# Patient Record
Sex: Female | Born: 1945 | Race: White | Hispanic: No | Marital: Married | Smoking: Never smoker
Health system: Southern US, Community
[De-identification: ages and names within clinical notes are randomized; demographics above are authoritative.]

---

## 1999-03-25 ENCOUNTER — Emergency Department (HOSPITAL_COMMUNITY): Admission: EM | Admit: 1999-03-25 | Discharge: 1999-03-25 | Payer: Self-pay | Admitting: Emergency Medicine

## 1999-03-25 ENCOUNTER — Encounter: Payer: Self-pay | Admitting: Emergency Medicine

## 1999-11-06 ENCOUNTER — Ambulatory Visit: Admission: RE | Admit: 1999-11-06 | Discharge: 1999-11-06 | Payer: Self-pay | Admitting: *Deleted

## 2000-06-04 ENCOUNTER — Ambulatory Visit: Admission: RE | Admit: 2000-06-04 | Discharge: 2000-06-04 | Payer: Self-pay | Admitting: *Deleted

## 2000-06-04 ENCOUNTER — Encounter: Payer: Self-pay | Admitting: *Deleted

## 2001-05-24 ENCOUNTER — Emergency Department (HOSPITAL_COMMUNITY): Admission: EM | Admit: 2001-05-24 | Discharge: 2001-05-25 | Payer: Self-pay | Admitting: Emergency Medicine

## 2001-05-24 ENCOUNTER — Encounter: Payer: Self-pay | Admitting: Emergency Medicine

## 2002-06-01 ENCOUNTER — Encounter: Payer: Self-pay | Admitting: Emergency Medicine

## 2002-06-01 ENCOUNTER — Emergency Department (HOSPITAL_COMMUNITY): Admission: EM | Admit: 2002-06-01 | Discharge: 2002-06-01 | Payer: Self-pay | Admitting: Emergency Medicine

## 2004-04-03 ENCOUNTER — Other Ambulatory Visit: Admission: RE | Admit: 2004-04-03 | Discharge: 2004-04-03 | Payer: Self-pay | Admitting: Obstetrics and Gynecology

## 2004-11-09 ENCOUNTER — Ambulatory Visit (HOSPITAL_COMMUNITY): Admission: RE | Admit: 2004-11-09 | Discharge: 2004-11-09 | Payer: Self-pay | Admitting: Obstetrics and Gynecology

## 2006-08-13 ENCOUNTER — Ambulatory Visit (HOSPITAL_COMMUNITY): Admission: RE | Admit: 2006-08-13 | Discharge: 2006-08-13 | Payer: Self-pay | Admitting: Obstetrics and Gynecology

## 2006-08-13 ENCOUNTER — Other Ambulatory Visit: Admission: RE | Admit: 2006-08-13 | Discharge: 2006-08-13 | Payer: Self-pay | Admitting: Obstetrics and Gynecology

## 2006-08-13 IMAGING — MG MM DIGITAL SCREENING BILAT
5 series · 5 of 5 positions shown · non-contrast
Comparison: Prior studies.

DG SCREEN MAMMOGRAM BILATERAL
Bilateral CC and MLO view(s) were taken.
Prior study comparison: [DATE], bilateral screening mammogram.

DIGITAL SCREENING MAMMOGRAM WITH CAD:

[R CC]
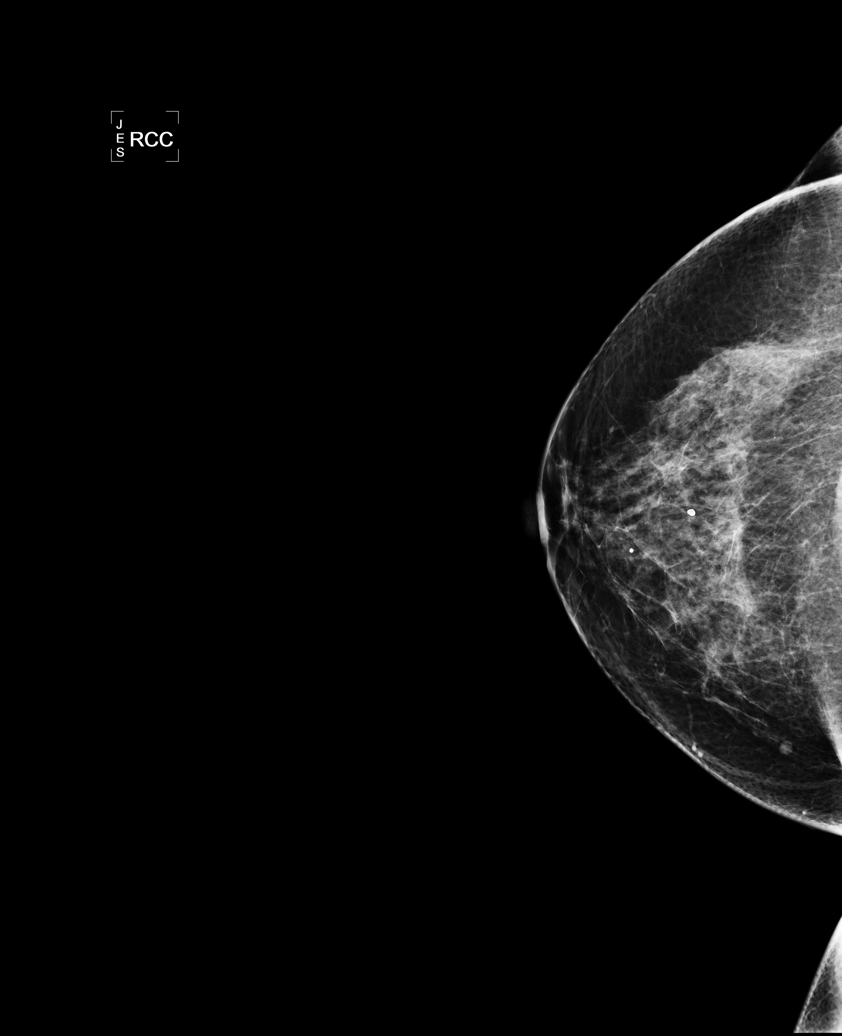

[R MLO]
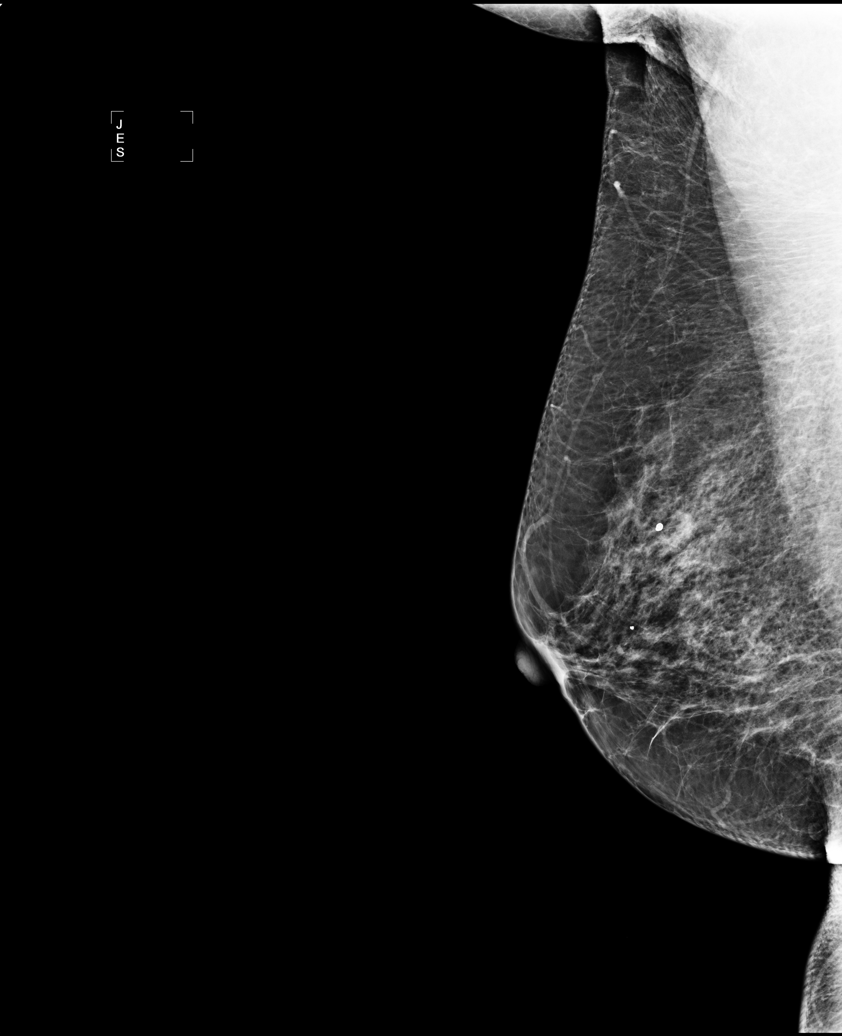

[L CC]
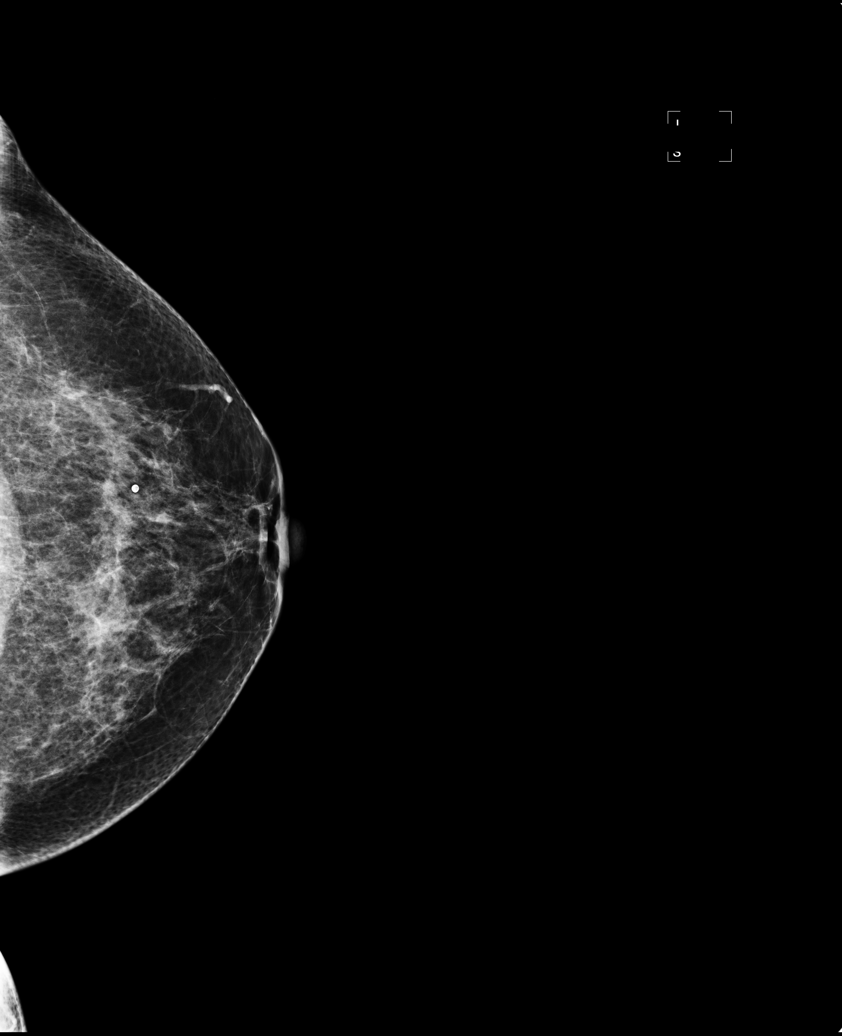

[L MLO (1 of 2)]
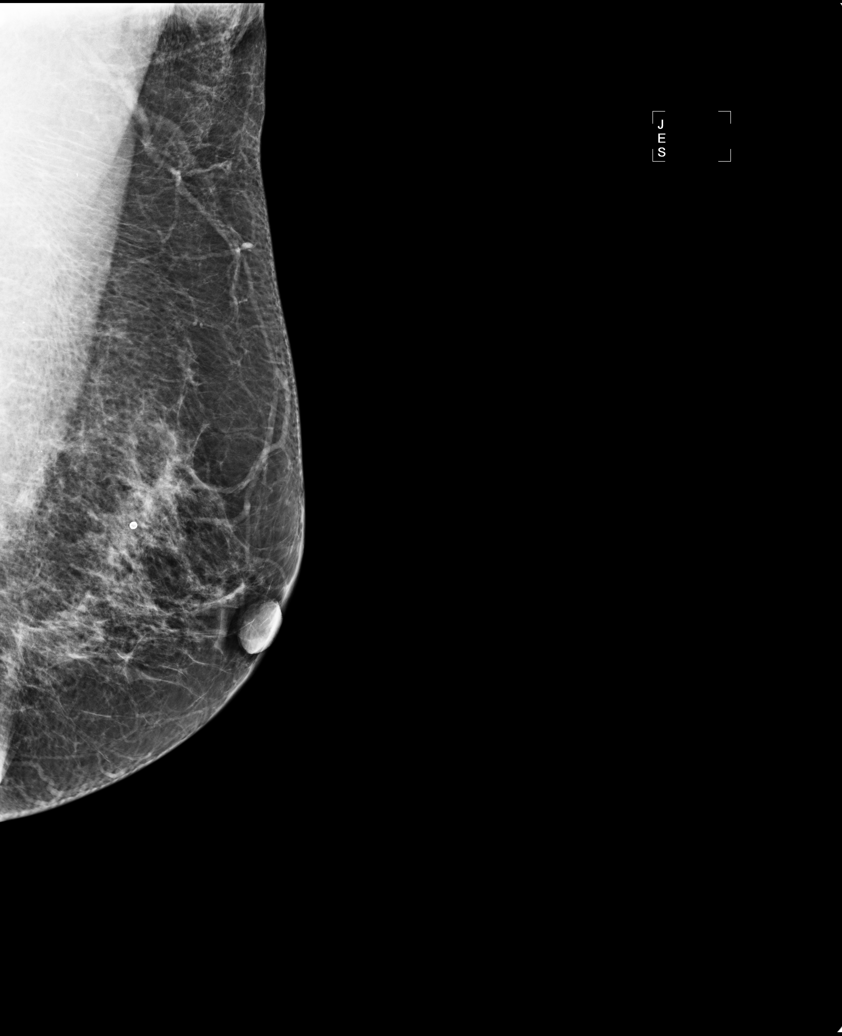

[L MLO (2 of 2)]
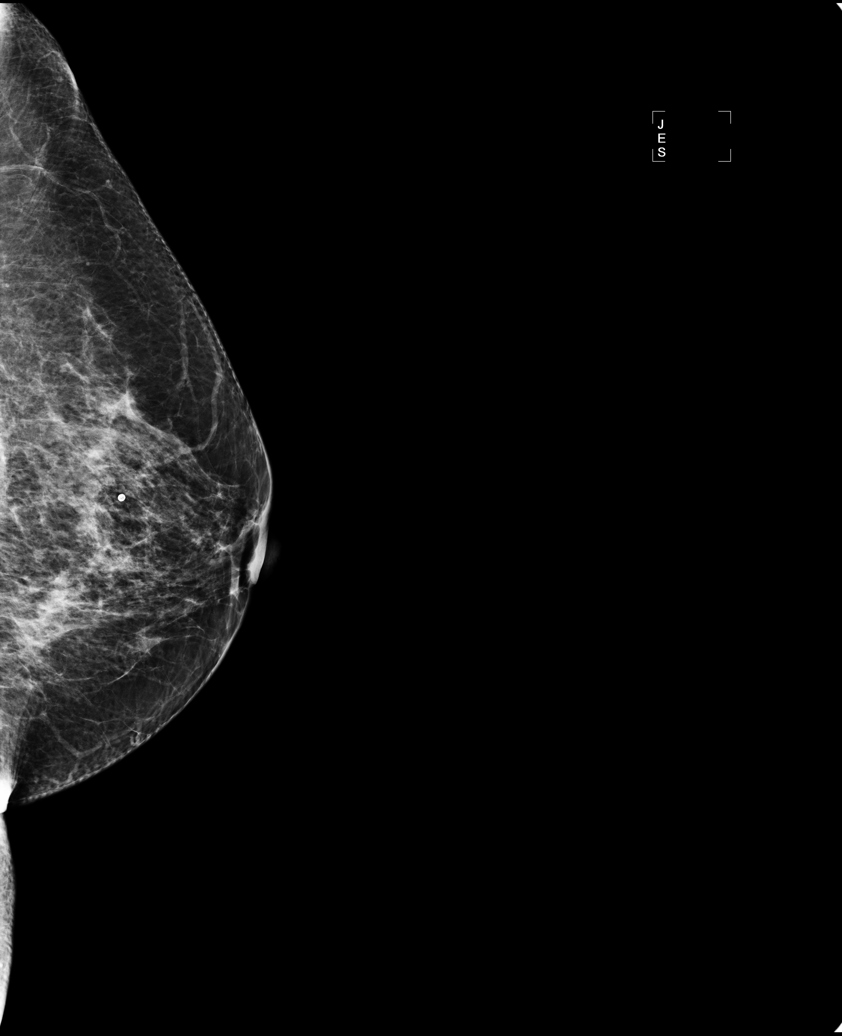

[5 of 5 positions shown; findings below may reference images not displayed]

There are scattered fibroglandular densities.  There is no dominant mass, architectural distortion 
or calcification to suggest malignancy.
IMPRESSION: No mammographic evidence of malignancy.  Suggest yearly screening mammography.

ASSESSMENT: Negative - BI-RADS 1

Screening mammogram in 1 year.
ANALYZED BY COMPUTER AIDED DETECTION. , THIS PROCEDURE WAS A DIGITAL MAMMOGRAM.

## 2006-09-09 ENCOUNTER — Encounter: Admission: RE | Admit: 2006-09-09 | Discharge: 2006-09-09 | Payer: Self-pay | Admitting: Obstetrics and Gynecology

## 2007-07-10 ENCOUNTER — Encounter: Payer: Self-pay | Admitting: Pulmonary Disease

## 2007-07-15 ENCOUNTER — Encounter: Payer: Self-pay | Admitting: Pulmonary Disease

## 2007-08-04 ENCOUNTER — Ambulatory Visit: Payer: Self-pay | Admitting: Pulmonary Disease

## 2007-08-04 DIAGNOSIS — K219 Gastro-esophageal reflux disease without esophagitis: Secondary | ICD-10-CM

## 2007-08-04 DIAGNOSIS — E785 Hyperlipidemia, unspecified: Secondary | ICD-10-CM

## 2007-08-04 DIAGNOSIS — R05 Cough: Secondary | ICD-10-CM

## 2007-08-11 ENCOUNTER — Other Ambulatory Visit: Admission: RE | Admit: 2007-08-11 | Discharge: 2007-08-11 | Payer: Self-pay | Admitting: Obstetrics and Gynecology

## 2008-06-22 ENCOUNTER — Other Ambulatory Visit: Admission: RE | Admit: 2008-06-22 | Discharge: 2008-06-22 | Payer: Self-pay | Admitting: Obstetrics and Gynecology

## 2008-06-22 ENCOUNTER — Encounter: Admission: RE | Admit: 2008-06-22 | Discharge: 2008-06-22 | Payer: Self-pay | Admitting: Obstetrics and Gynecology

## 2008-06-22 IMAGING — MG MM SCREEN MAMMOGRAM BILATERAL
5 series · 5 of 5 positions shown · non-contrast
Comparison: none

DG SCREEN MAMMOGRAM BILATERAL
Bilateral CC and MLO view(s) were taken.

DIGITAL SCREENING MAMMOGRAM WITH CAD:
There are scattered fibroglandular densities.  No masses or malignant type calcifications are 
identified.  Compared with prior studies.

[R CC (1 of 2)]
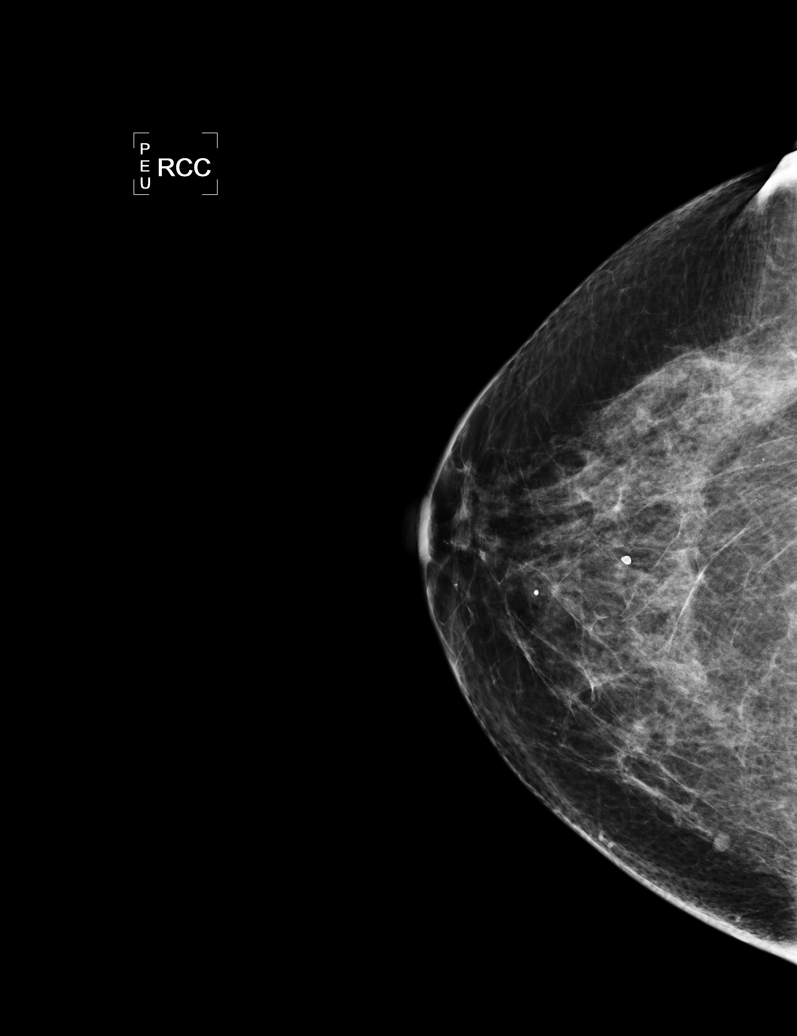

[L CC]
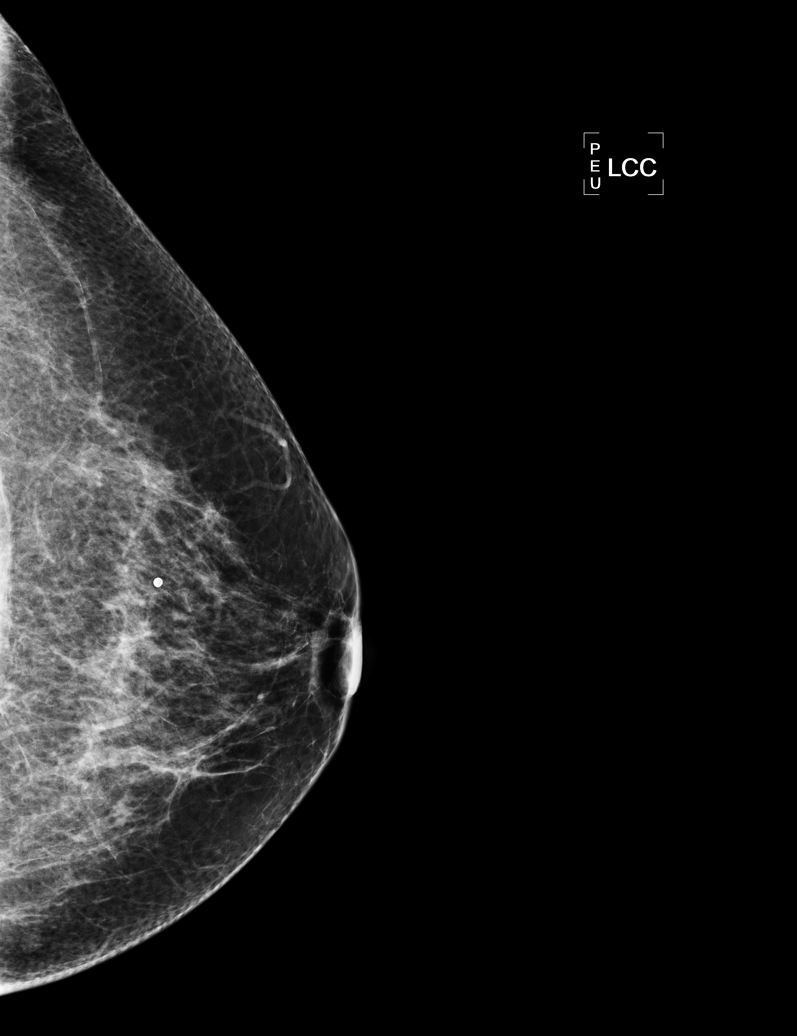

[L MLO]
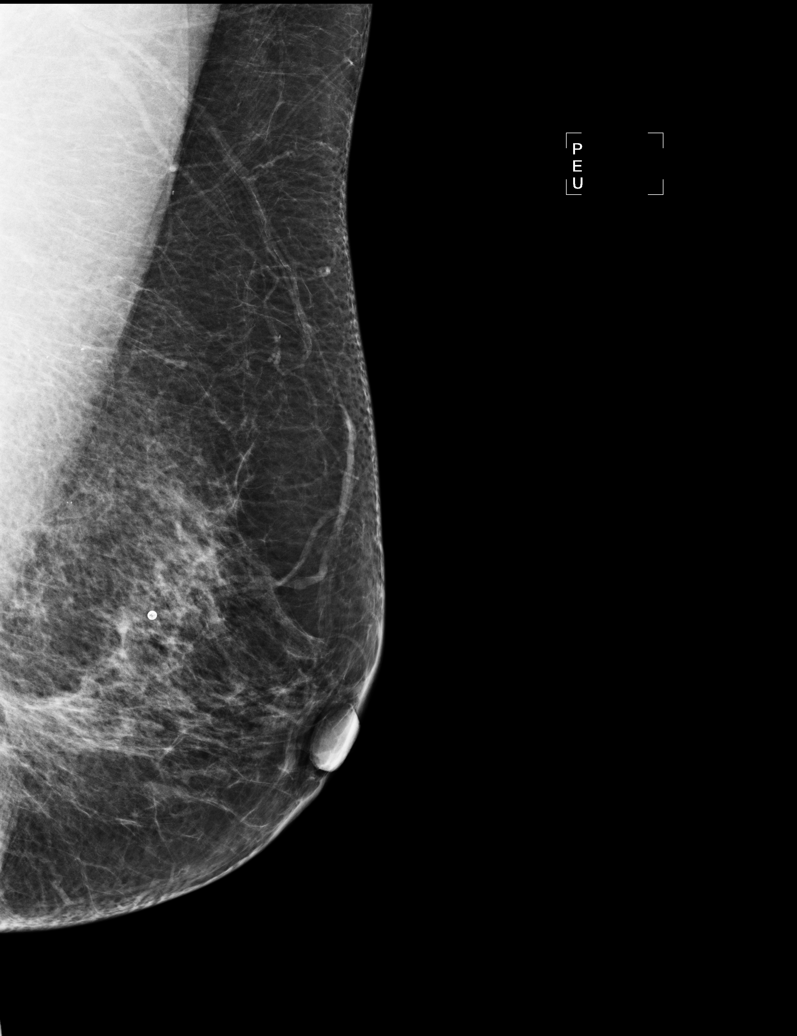

[R MLO]
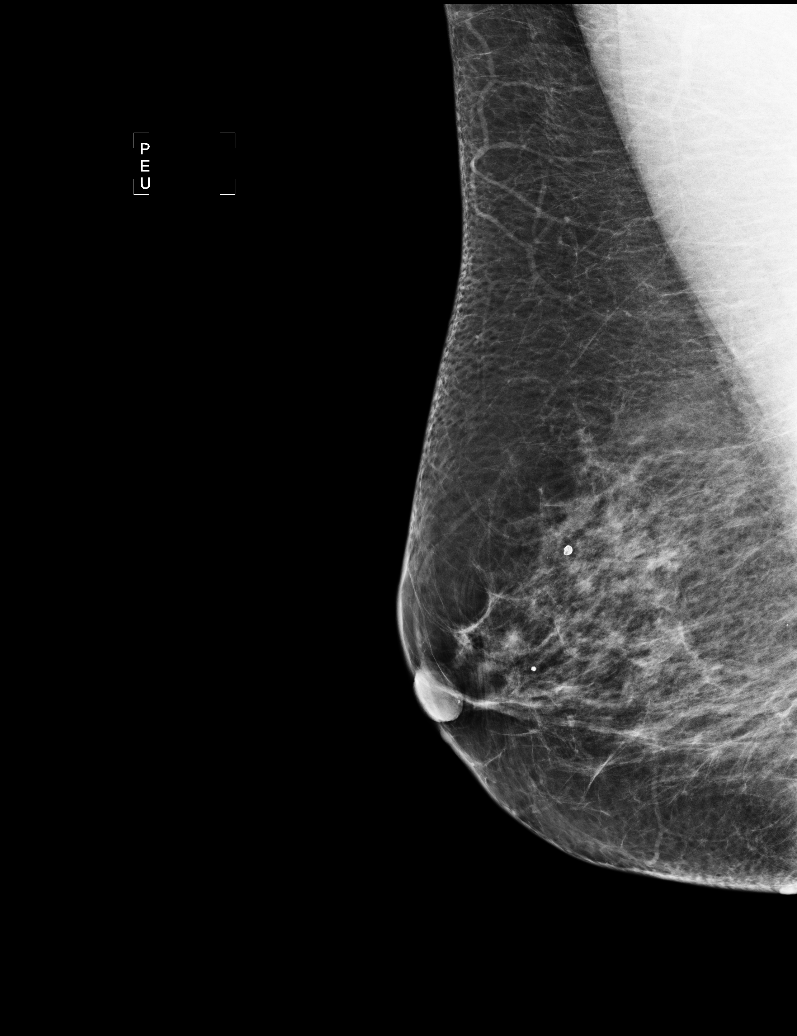

[R CC (2 of 2)]
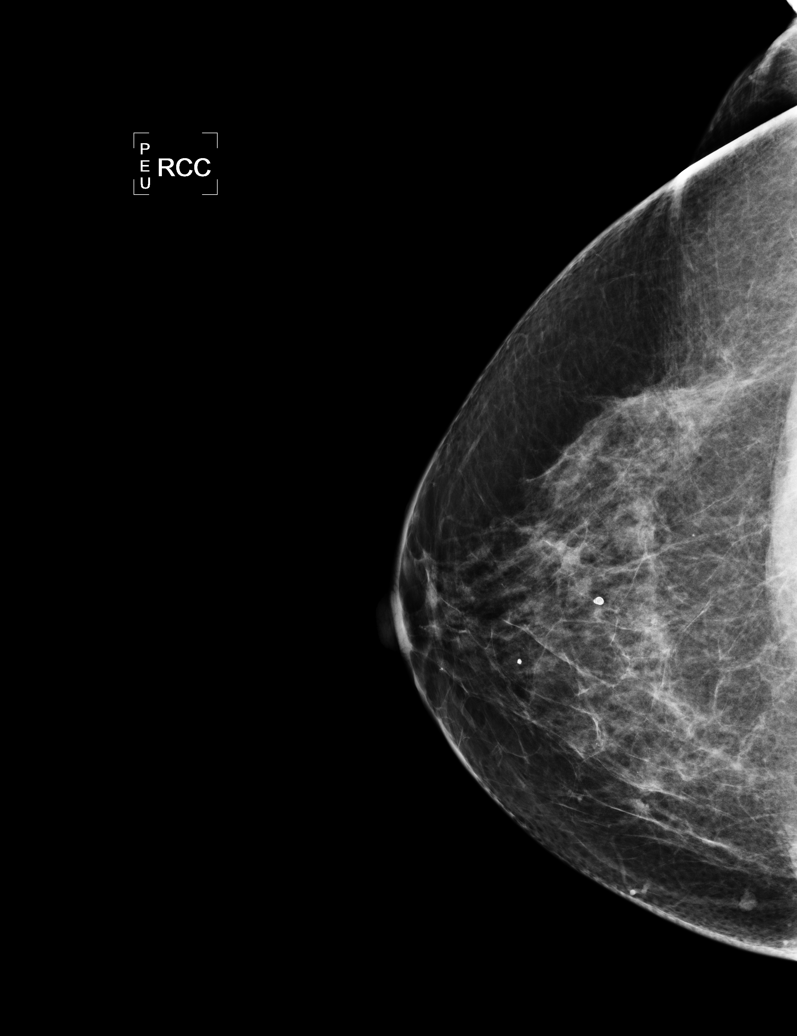

[5 of 5 positions shown; findings below may reference images not displayed]

IMPRESSION: No specific mammographic evidence of malignancy.  Next screening mammogram is recommended in one 
year.

A result letter of this screening mammogram will be mailed directly to the patient.

ASSESSMENT: Negative - BI-RADS 1

Screening mammogram in 1 year.
ANALYZED BY COMPUTER AIDED DETECTION. , THIS PROCEDURE WAS A DIGITAL MAMMOGRAM.

## 2009-06-11 ENCOUNTER — Ambulatory Visit: Payer: Self-pay | Admitting: Family Medicine

## 2009-06-11 DIAGNOSIS — B029 Zoster without complications: Secondary | ICD-10-CM | POA: Insufficient documentation

## 2009-09-20 ENCOUNTER — Other Ambulatory Visit
Admission: RE | Admit: 2009-09-20 | Discharge: 2009-09-20 | Payer: Self-pay | Source: Home / Self Care | Admitting: Obstetrics and Gynecology

## 2009-10-31 ENCOUNTER — Emergency Department: Payer: Self-pay | Admitting: Emergency Medicine

## 2009-10-31 IMAGING — CT CT HEAD WITHOUT CONTRAST
2 series · 16 of 30 positions shown, 20 images · non-contrast
Comparison: none

REASON FOR EXAM: new onset vertigo with atypical severe headache at onset.
COMMENTS:

PROCEDURE:     CT  - CT HEAD WITHOUT CONTRAST  - [DATE]  [DATE]
RESULT:
HISTORY: New-onset vertigo, severe headache.

[Series 2: without · axial · non-contrast · 0.40mm/px · z∈[+245,+365]mm · 13 of 29 slices shown, 17 images]
[im 3/29  brain]
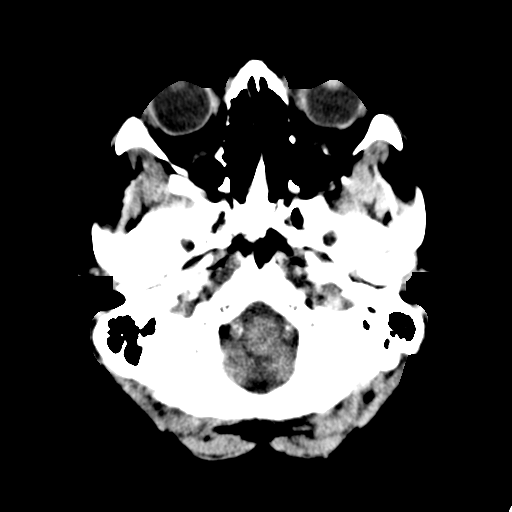
[im 3/29  bone]
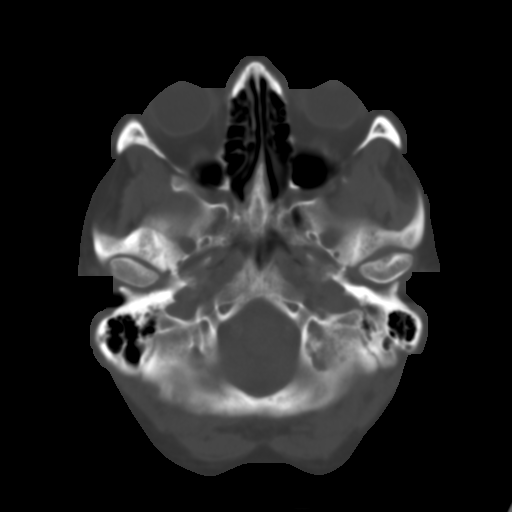
[im 5/29  brain]
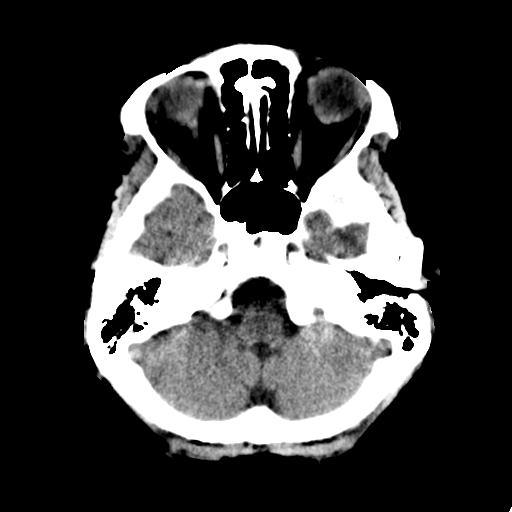
[im 7/29  brain]
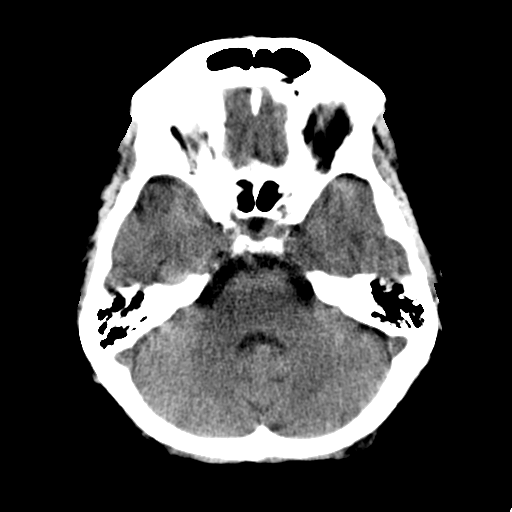
[im 9/29  brain]
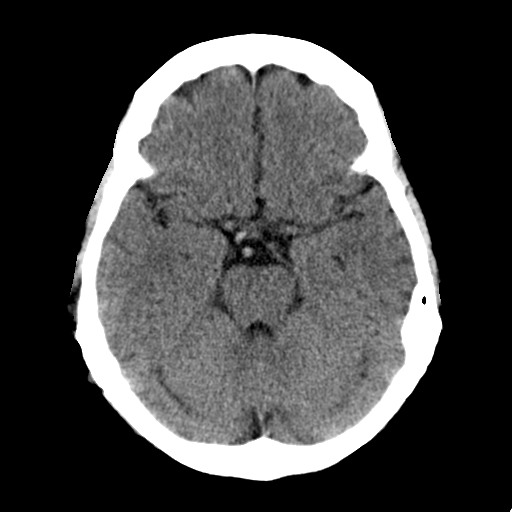
[im 11/29  brain]
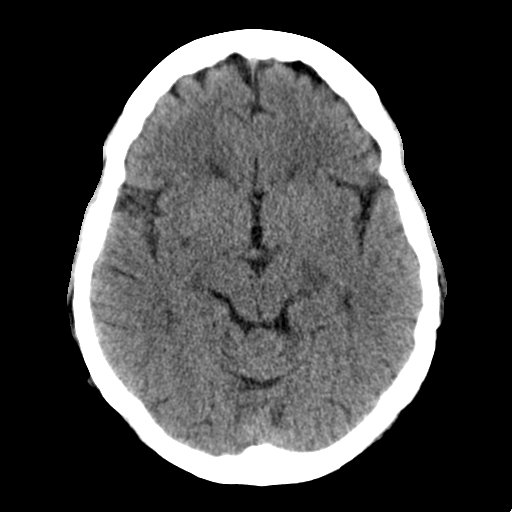
[im 11/29  bone]
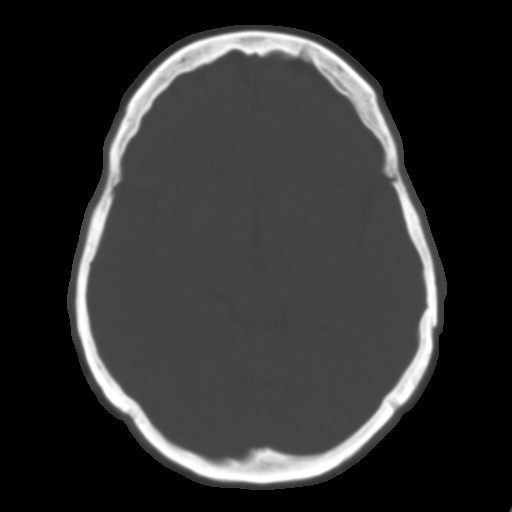
[im 13/29  brain]
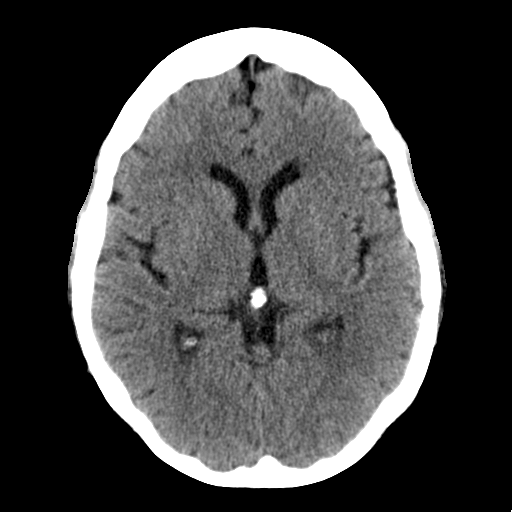
[im 15/29  brain]
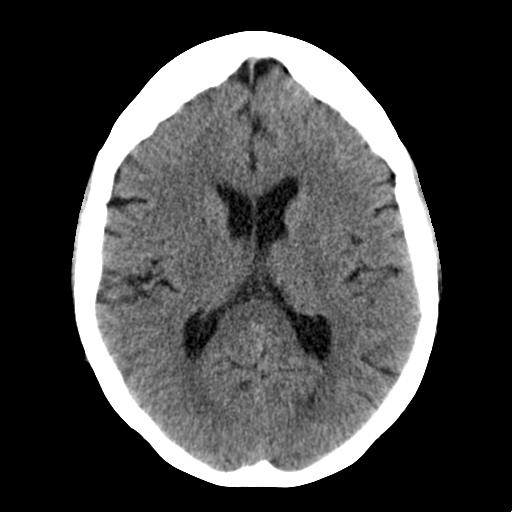
[im 17/29  brain]
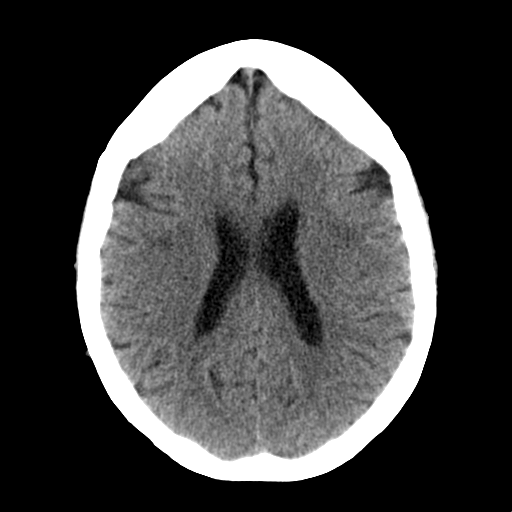
[im 19/29  brain]
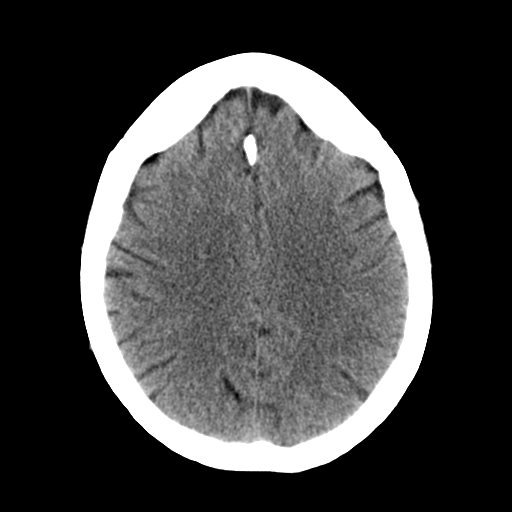
[im 19/29  bone]
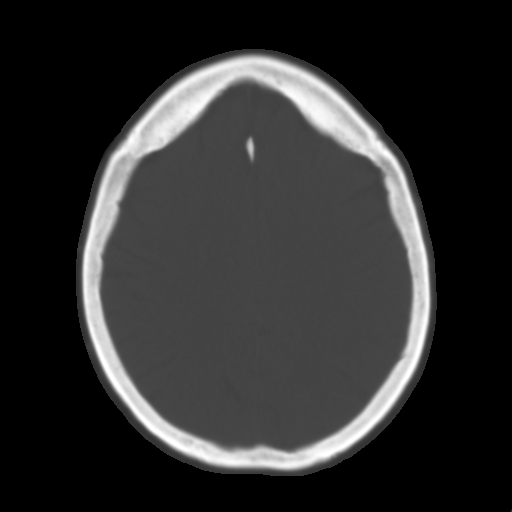
[im 21/29  brain]
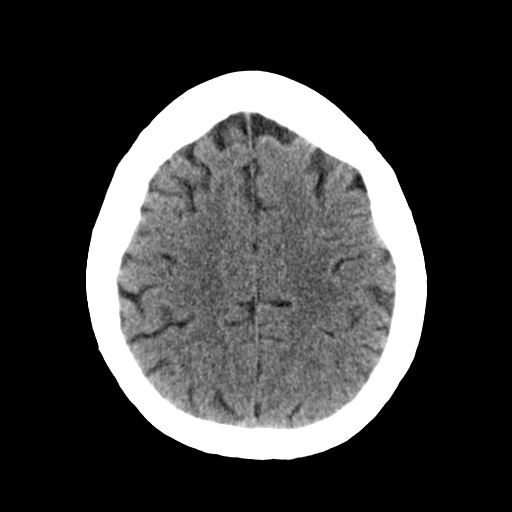
[im 23/29  brain]
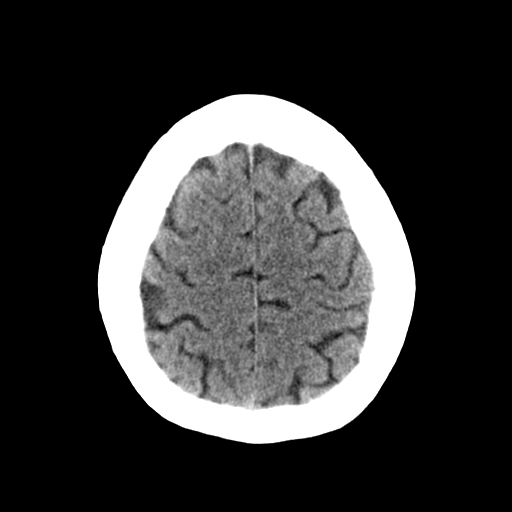
[im 25/29  brain]
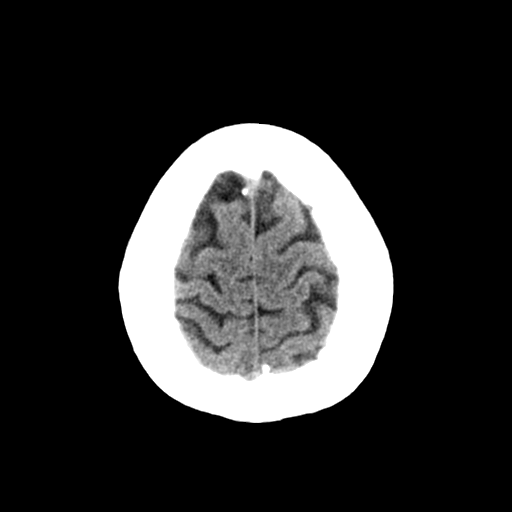
[im 27/29  brain]
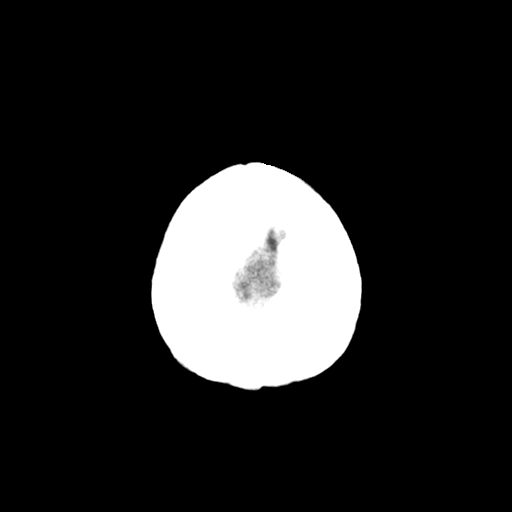
[im 27/29  bone]
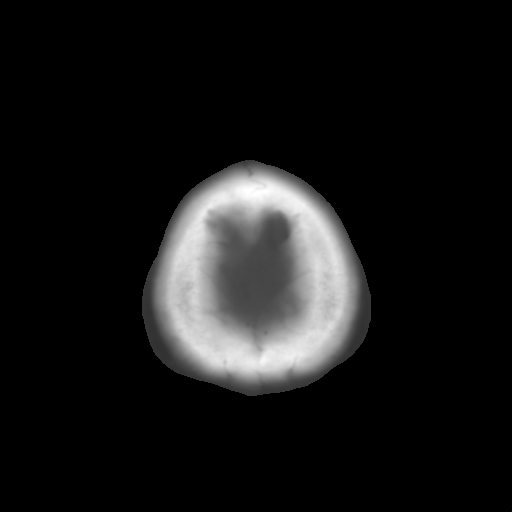

[Series 3: bone · axial · 0.40mm/px · z∈[+245,+285]mm · 3 of 29 slices shown]
[im 3/29  bone]
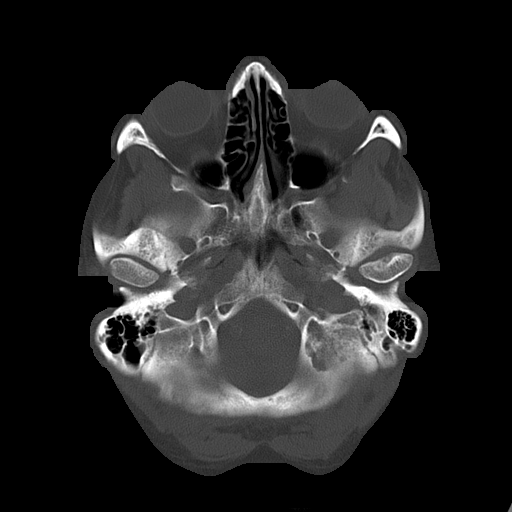
[im 7/29  bone]
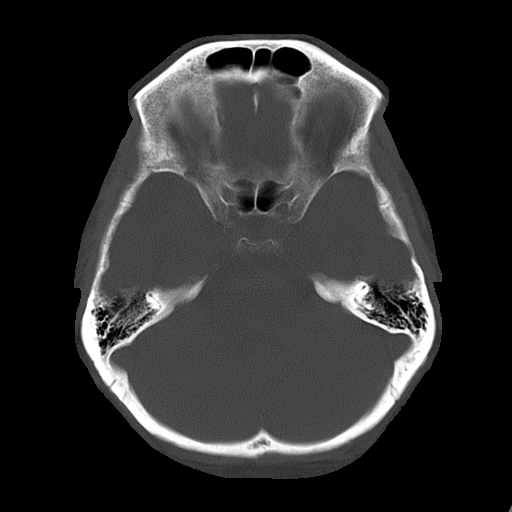
[im 11/29  bone]
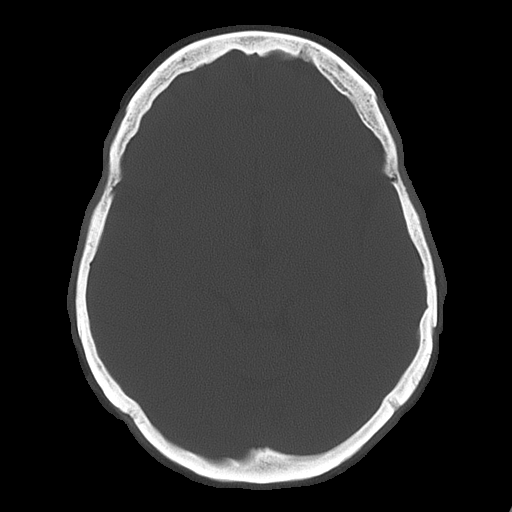

[16 of 30 positions shown; findings below may reference images not displayed]

PROCEDURE AND FINDINGS:   Nonenhanced head CT was obtained. No prior exams
are available for comparison. No mass lesion. No hemorrhage. No
hydrocephalus. No bony abnormality. Old lacunae in the right lenticular
nucleus cannot be excluded. White matter changes most likely from chronic
ischemia present.
IMPRESSION: 1.     No acute abnormality.
2.     White matter changes suggesting chronic ischemia and old lacunar in
the right lenticular nucleus noted. No hemorrhage.

## 2010-03-16 NOTE — Letter (Signed)
Summary: Generic Letter  [facility]  [address]   [place], [place] [address]   Phone: [phone]  Fax: [phone]    06/11/2009 MRN: [id number]  [address] [place], [place]  [address]  Dear Ms. FRENCH,  1.  Return or seek medical care if symptoms worsen or don't improve.  2. Return for fever, chills, nausea, severe worsening pain.  3.  follow up with primary physician in 3 - 5 days  4. Ask pharmacist to show you how to use inhaler.  5. Ask Pharmacist how to take the Z-pack correctly.     Sincerely,   Rodney Langton, M.D., F.A.A.F.P.  June 11, 2009  [facility]

## 2010-03-16 NOTE — Assessment & Plan Note (Signed)
Summary: POSSIBLE SPIDER BITE/JBB   Vital Signs:  Patient Profile:   65 Years Old Female CC:      rash with pain over left side and buttock Height:     63 inches Weight:      172 pounds Temp:     98.6 degrees F oral Pulse rate:   80 / minute Pulse rhythm:   regular Resp:     18 per minute BP sitting:   120 / 60  (right arm) Cuff size:   regular  Vitals Entered By: Providence Crosby LPN (June 11, 2009 2:18 PM)                  Prior Medications Reviewed Using: Patient Recall  Updated Prior Medication List: NEXIUM 40 MG  CPDR (ESOMEPRAZOLE MAGNESIUM) Take 1 tablet by mouth once a day as needed CELEXA 20 MG  TABS (CITALOPRAM HYDROBROMIDE) Take 1 tablet by mouth once a day NEXIUM 40 MG  CPDR (ESOMEPRAZOLE MAGNESIUM) one in am and pm  Current Allergies: ! SULFAHistory of Present Illness History from: patient Reason for visit: rash over leftbuttock very painfulh Chief Complaint: rash with pain over left side and buttock History of Present Illness: Rash started 2 days ago // third day before that had pain of lower abd. radiating down left thigh.the patient reports that the sensation is similar to burning and pain that shoots down the left leg. She reports that her sister told her that it looked like shingles. The patient reports that she's had no fever or chills. She reports that she has only noticed the rash for approximately 2-3 days. She is concerned about it. She said that initially she didn't think it was a spider bite but she noticed that the bumps increased in number. They have become ulcerated in some places. And there very tender. She reports that she may have had a history of genital herpes several years ago but she's not sure.  REVIEW OF SYSTEMS Constitutional Symptoms       Complains of fatigue.     Denies fever, chills, night sweats, weight loss, and weight gain.  Eyes       Denies change in vision, eye pain, eye discharge, glasses, contact lenses, and eye  surgery. Ear/Nose/Throat/Mouth       Complains of ear discharge and sinus problems.      Denies hearing loss/aids, change in hearing, ear pain, dizziness, frequent runny nose, frequent nose bleeds, sore throat, hoarseness, and tooth pain or bleeding.  Respiratory       Complains of dry cough.      Denies asthma, bronchitis, and emphysema/COPD.     Gastrointestinal       Denies stomach pain, nausea/vomiting, diarrhea, constipation, blood in bowel movements, and indigestion. Genitourniary       Denies painful urination, blood or discharge from vagina, kidney stones, and loss of urinary control. Neurological       Complains of numbness.      Denies headaches, loss of or changes in sensation, tngling, tremors, paralysis, seizures, and fainting/blackouts. Musculoskeletal       Complains of muscle pain, joint stiffness, and muscle weakness.      Denies joint pain, decreased range of motion, redness, swelling, and gout.  Skin       Denies bruising, unusual mles/lumps or sores, and hair/skin or nail changes.      Comments: left side and buttock Psych       Denies mood changes, temper/anger issues, anxiety/stress, speech problems,  depression, and sleep problems.  Past History:  Past Surgical History: Last updated: 08/04/2007 status post tubal ligation  Family History: Last updated: 06/11/2009 asthma: sister heart disease: father cancer: sister (pancreatic) The patient reports that her sister survived pancreatic cancer.  Social History: Last updated: 08/04/2007 pt is widowed. pt has children. pt is retired from [employer].  Pt still occ. works at [employer] in [place]. Patient never smoked.   Risk Factors: Smoking Status: never (08/04/2007)  Past Medical History: Current Problems:  COUGH (ICD-786.2) HYPERLIPIDEMIA (ICD-272.4) G E R D (ICD-530.81) ? history of genital herpes  Family History: asthma: sister heart disease: father cancer: sister  (pancreatic) The patient reports that her sister survived pancreatic cancer. Physical Exam General appearance: well developed, well nourished, no acute distress Head: normocephalic, atraumatic Eyes: conjunctivae and lids normal Pupils: equal, round, reactive to light Ears: normal, no lesions or deformities Nasal: mucosa pink, nonedematous, no septal deviation, turbinates normal Oral/Pharynx: tongue normal, posterior pharynx without erythema or exudate Neck: neck supple,  trachea midline, no masses, no lymphadenopathy Chest/Lungs: patient has a deep raspy cough, upper anterior airway congested and expiratory wheezes heard bilaterally in the upper lung fields, no crackles heard. Heart: regular rate and  rhythm, no murmur Abdomen: soft, non-tender without obvious organomegaly Extremities: normal extremities Neurological: grossly intact and non-focal Skin: clustered herpetic appearing lesions small posterior lesions with central pallor in clusters on the left hip around the beltline. No other herpetic lesions seen. 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 Demonstrates willingness to comply.  Plan New Medications/Changes: FAMVIR 500 MG TABS (FAMCICLOVIR) 1 by mouth by mouth Q 8 hours  #21 x 0, 06/11/2009, Deyanna Mctier MD PROAIR HFA 108 (90 BASE) MCG/ACT AERS (ALBUTEROL SULFATE) 2 puffs Q 3 hours as needed cough, wheezing, SOB  #1 x 0, 06/11/2009, Loretta Kluender MD ZITHROMAX 1 GM PACK (AZITHROMYCIN) take as directed  #1 pack x 0, 06/11/2009, Kain Milosevic MD FAMVIR 500 MG TABS (FAMCICLOVIR) 1 by mouth by mouth Q 8 hours  #21 x 0, 06/11/2009, Standley Dakins MD  New Orders: New Patient  Level III [95621] Follow Up: Follow up in 2-3 days if no improvement, Follow up with Primary Physician  The patient and/or caregiver has been counseled thoroughly with regard to medications prescribed including dosage, schedule, interactions, rationale for use, and possible side effects and they verbalize understanding.  Diagnoses and expected course of recovery discussed and will return if not improved as expected or if the condition worsens. Patient and/or caregiver verbalized understanding.  Prescriptions: FAMVIR 500 MG TABS (FAMCICLOVIR) 1 by mouth by mouth Q 8 hours  #21 x 0   Entered and Authorized by:   Standley Dakins MD   Signed by:   Standley Dakins MD on 06/11/2009   Method used:   Electronically to        [facility]. [address] [phone]* (retail)       [address] [place], [place]  [id number]       Ph: [phone]       Fax: [phone]   RxID:   [id number] PROAIR HFA 108 (90 BASE) MCG/ACT AERS (ALBUTEROL SULFATE) 2 puffs Q 3 hours as needed  cough, wheezing, SOB  #1 x 0   Entered and Authorized by:   Standley Dakins MD   Signed by:   Standley Dakins MD on 06/11/2009   Method used:   Electronically to        [facility]. [address] [phone]* (retail)       [address] [place], [place]  [id number]       Ph: [phone]       Fax: [phone]   RxID:   [id number] ZITHROMAX 1 GM PACK (AZITHROMYCIN) take as directed  #1 pack x 0   Entered and Authorized by:   Standley Dakins MD   Signed by:   Standley Dakins MD on 06/11/2009   Method used:   Electronically to        [facility]. [address] [phone]* (retail)       [address] [place], [place]  [id number]       Ph: [phone]       Fax: [phone]   RxID:   [id number] FAMVIR 500 MG TABS (FAMCICLOVIR) 1 by mouth by mouth Q 8 hours  #21 x 0   Entered and Authorized by:   Standley Dakins MD   Signed by:   Standley Dakins MD on 06/11/2009   Method used:   Electronically to         [facility] [address] [phone]* (retail)       [address] [place]  [address]       Ph: [phone]       Fax: [phone]   RxID:   [id number]   Patient Instructions: 1)  please see handout that was given to patient prior to discharge. 2)  Rodney Langton, M.D., F.A.A.F.P.  June 11, 2009  I called the patient's pharmacy to make sure that they understood that I wanted the patient to have a regular Z-Pak 250 mg tablets that she would take as directed. I was able to get through to the pharmacist and make the necessary changes. The EMR medication generator did not have the right formulation of Zithromax and I wanted to use. Rodney Langton, M.D., F.A.A.F.P.  June 11, 2009   I have reviewed the above medical office visit documention, including diagnoses, history, medications, clinical lists, orders and plan of care.   Rodney Langton, MD, FAAFP  June 11, 2009       Appended Document: POSSIBLE SPIDER BITE/JBB    Clinical Lists Changes  Problems: Changed problem from BRONCHITIS, ACUTE WITH MILD BRONCHOSPASM (ICD-466.0) to BRONCHITIS, ACUTE WITH MILD BRONCHOSPASM (ICD-466.0) Orders: Added new Service order of Nebulizer Tx (03500) - Signed

## 2010-10-03 ENCOUNTER — Other Ambulatory Visit: Payer: Self-pay | Admitting: Obstetrics and Gynecology

## 2010-10-10 ENCOUNTER — Other Ambulatory Visit: Payer: Self-pay | Admitting: Obstetrics and Gynecology

## 2010-10-10 DIAGNOSIS — N644 Mastodynia: Secondary | ICD-10-CM

## 2010-10-13 ENCOUNTER — Ambulatory Visit
Admission: RE | Admit: 2010-10-13 | Discharge: 2010-10-13 | Disposition: A | Discharge status: Home / Self Care | Payer: Commercial Managed Care - PPO | Source: Ambulatory Visit | Attending: Obstetrics and Gynecology | Admitting: Obstetrics and Gynecology

## 2010-10-13 DIAGNOSIS — N644 Mastodynia: Secondary | ICD-10-CM

## 2010-11-23 ENCOUNTER — Other Ambulatory Visit: Payer: Self-pay | Admitting: Obstetrics and Gynecology

## 2010-11-23 ENCOUNTER — Other Ambulatory Visit (HOSPITAL_COMMUNITY)
Admission: RE | Admit: 2010-11-23 | Discharge: 2010-11-23 | Disposition: A | Discharge status: Home / Self Care | Payer: Medicare Other | Source: Ambulatory Visit | Attending: Obstetrics and Gynecology | Admitting: Obstetrics and Gynecology

## 2010-11-23 DIAGNOSIS — Z124 Encounter for screening for malignant neoplasm of cervix: Secondary | ICD-10-CM | POA: Insufficient documentation

## 2011-02-01 ENCOUNTER — Other Ambulatory Visit: Payer: Self-pay | Admitting: Gastroenterology

## 2011-11-29 ENCOUNTER — Other Ambulatory Visit (HOSPITAL_COMMUNITY)
Admission: RE | Admit: 2011-11-29 | Discharge: 2011-11-29 | Disposition: A | Discharge status: Home / Self Care | Payer: Medicare Other | Source: Ambulatory Visit | Attending: Obstetrics and Gynecology | Admitting: Obstetrics and Gynecology

## 2011-11-29 ENCOUNTER — Other Ambulatory Visit: Payer: Self-pay | Admitting: Obstetrics and Gynecology

## 2011-11-29 DIAGNOSIS — Z124 Encounter for screening for malignant neoplasm of cervix: Secondary | ICD-10-CM | POA: Insufficient documentation

## 2014-03-30 ENCOUNTER — Other Ambulatory Visit (HOSPITAL_COMMUNITY)
Admission: RE | Admit: 2014-03-30 | Discharge: 2014-03-30 | Disposition: A | Discharge status: Home / Self Care | Payer: Medicare Other | Source: Ambulatory Visit | Attending: Obstetrics and Gynecology | Admitting: Obstetrics and Gynecology

## 2014-03-30 ENCOUNTER — Other Ambulatory Visit: Payer: Self-pay | Admitting: Obstetrics and Gynecology

## 2014-03-30 DIAGNOSIS — Z1151 Encounter for screening for human papillomavirus (HPV): Secondary | ICD-10-CM | POA: Insufficient documentation

## 2014-03-30 DIAGNOSIS — Z124 Encounter for screening for malignant neoplasm of cervix: Secondary | ICD-10-CM | POA: Diagnosis present

## 2014-04-01 LAB — CYTOLOGY - PAP

## 2019-04-04 ENCOUNTER — Ambulatory Visit: Payer: Medicare Other

## 2019-04-04 ENCOUNTER — Other Ambulatory Visit: Payer: Self-pay

## 2021-03-17 ENCOUNTER — Other Ambulatory Visit: Payer: Self-pay | Admitting: Family Medicine

## 2021-03-17 DIAGNOSIS — H93A2 Pulsatile tinnitus, left ear: Secondary | ICD-10-CM

## 2021-03-20 ENCOUNTER — Other Ambulatory Visit: Payer: Self-pay | Admitting: Family Medicine

## 2021-03-20 DIAGNOSIS — M8588 Other specified disorders of bone density and structure, other site: Secondary | ICD-10-CM

## 2021-04-03 ENCOUNTER — Ambulatory Visit
Admission: RE | Admit: 2021-04-03 | Discharge: 2021-04-03 | Disposition: A | Discharge status: Home / Self Care | Payer: Self-pay | Source: Ambulatory Visit | Attending: Family Medicine | Admitting: Family Medicine

## 2021-04-03 ENCOUNTER — Other Ambulatory Visit: Payer: Self-pay

## 2021-04-03 DIAGNOSIS — H93A2 Pulsatile tinnitus, left ear: Secondary | ICD-10-CM

## 2021-04-03 IMAGING — MR MR MRA HEAD W/O CM
1 series · 11 of 48 positions shown · non-contrast
Comparison: No pertinent prior exam.

CLINICAL DATA: Pulsatile tinnitus of left ear with hearing loss.

EXAM:
MRA HEAD WITHOUT CONTRAST
TECHNIQUE: Angiographic images of the Circle of Willis were acquired using MRA
technique without intravenous contrast.

[Series 5: tof_fl3d_tra_p2_multi-slab · axial · 0.6mm · 0.26mm/px · z∈[-90,+0]mm · 11 of 175 slices shown]
[im 12/175]
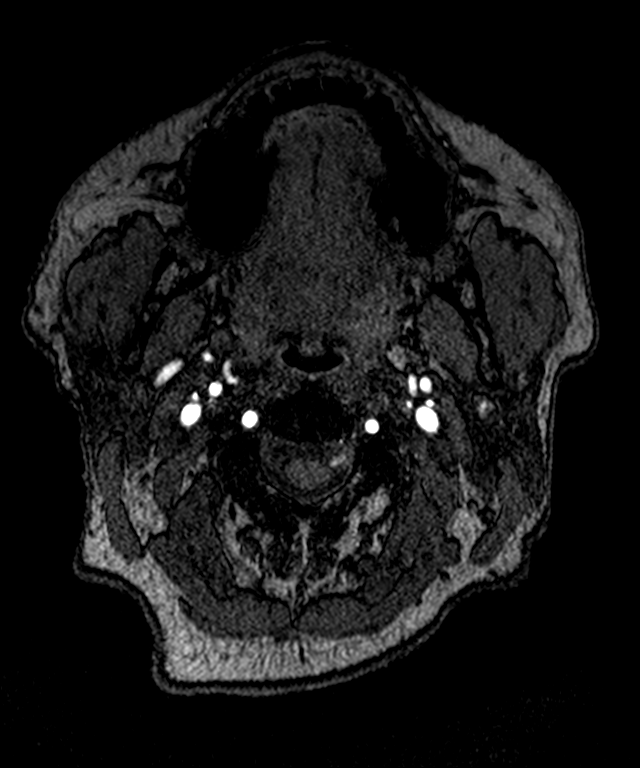
[im 30/175]
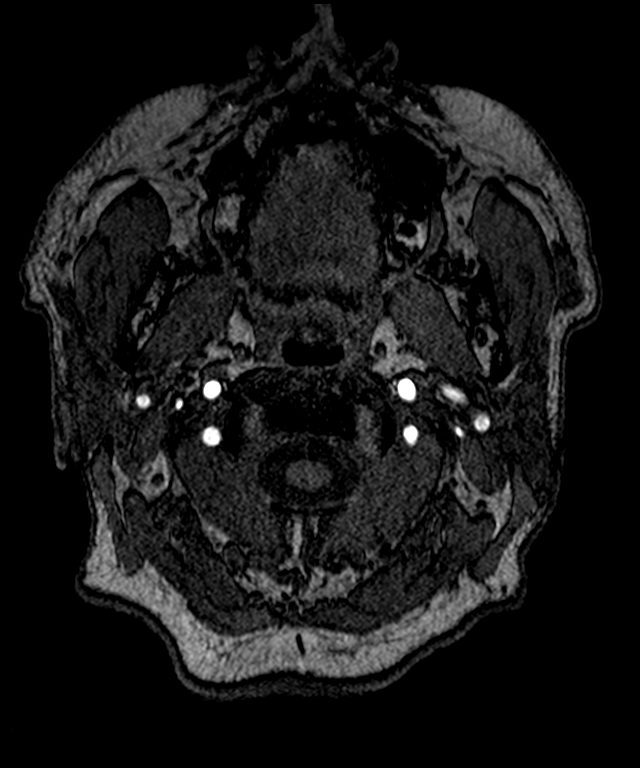
[im 34/175]
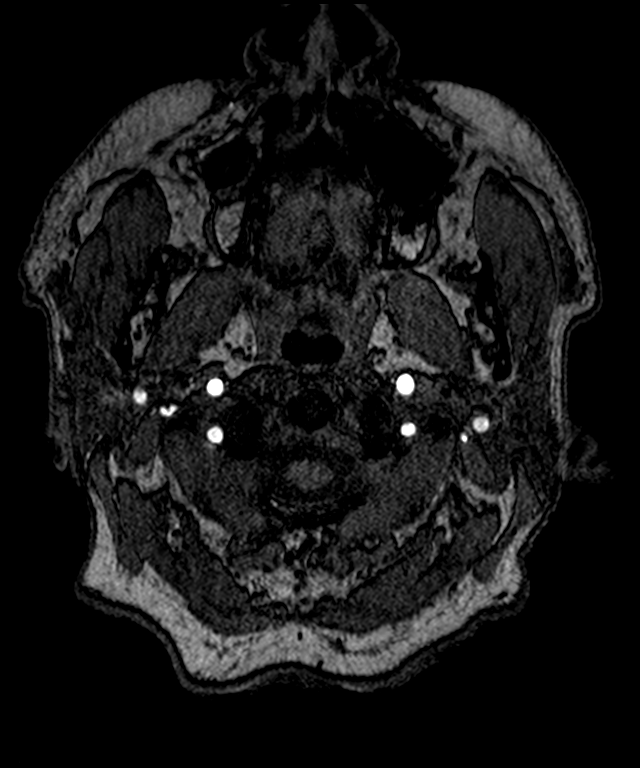
[im 56/175]
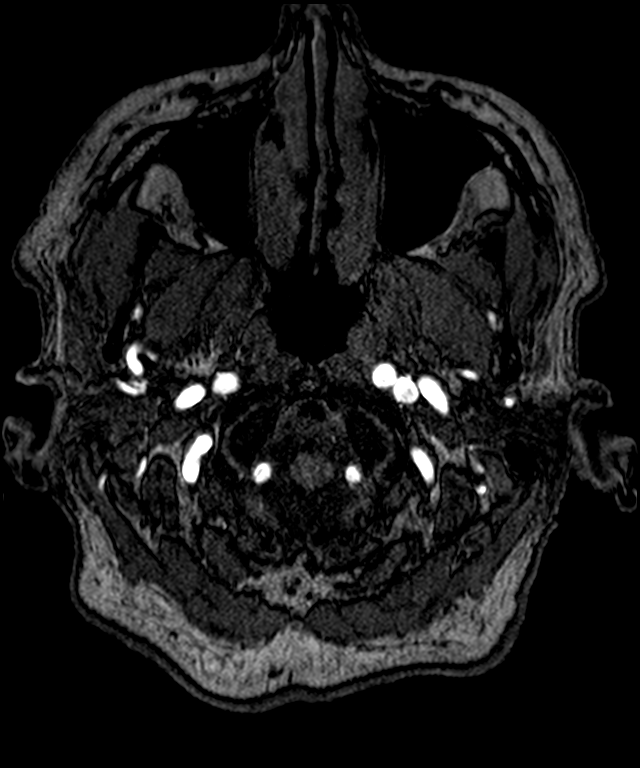
[im 78/175]
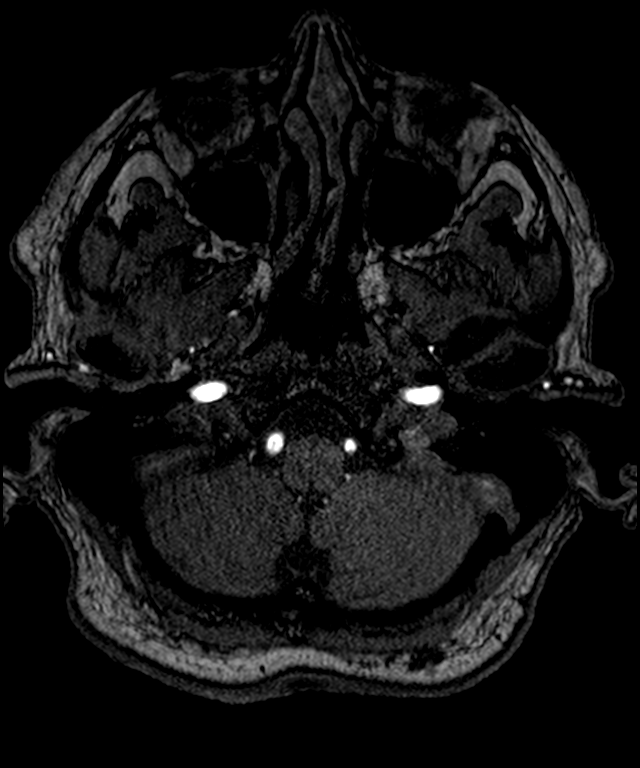
[im 89/175]
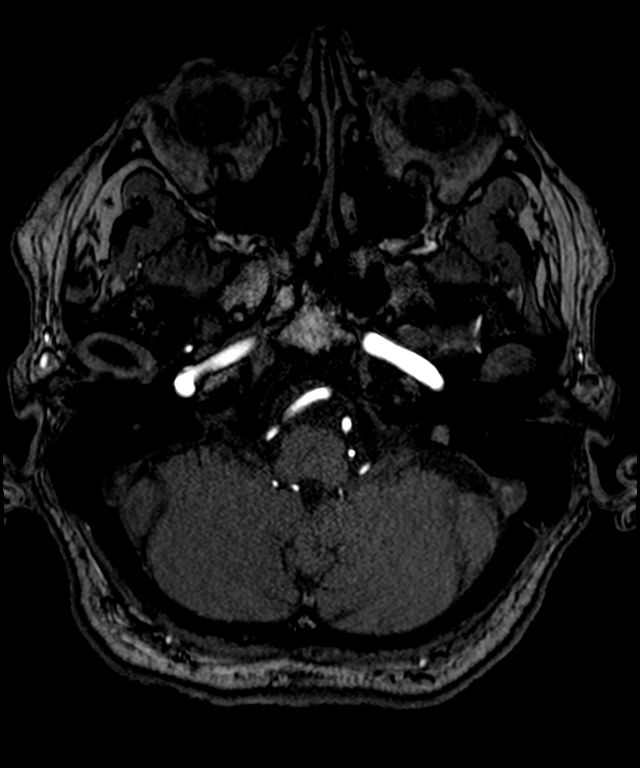
[im 100/175]
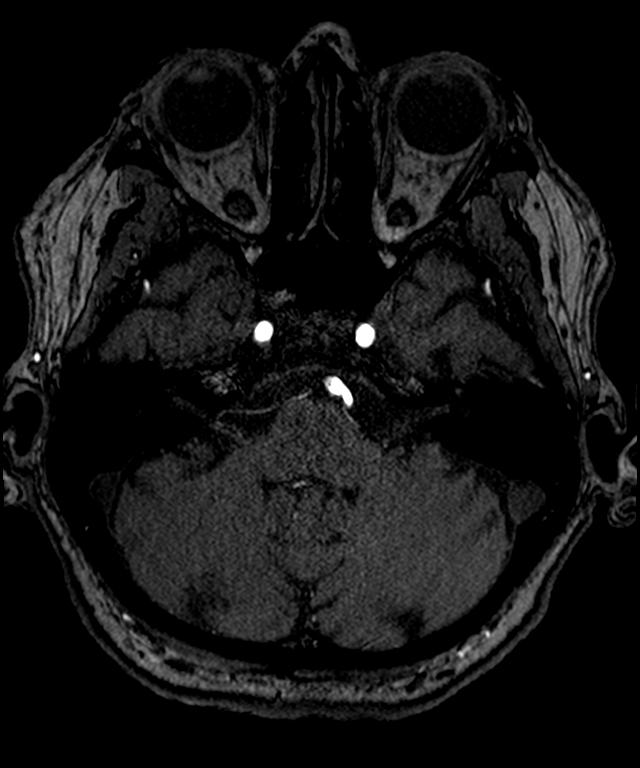
[im 123/175]
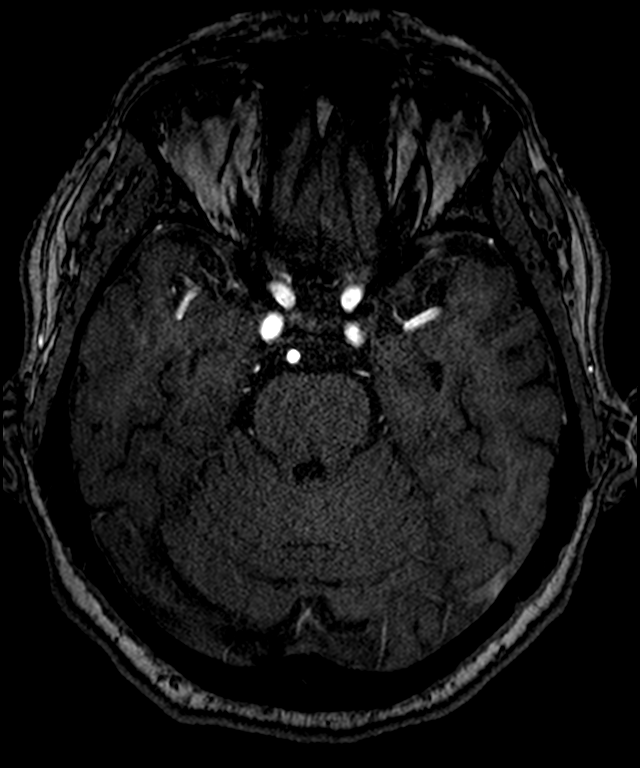
[im 145/175]
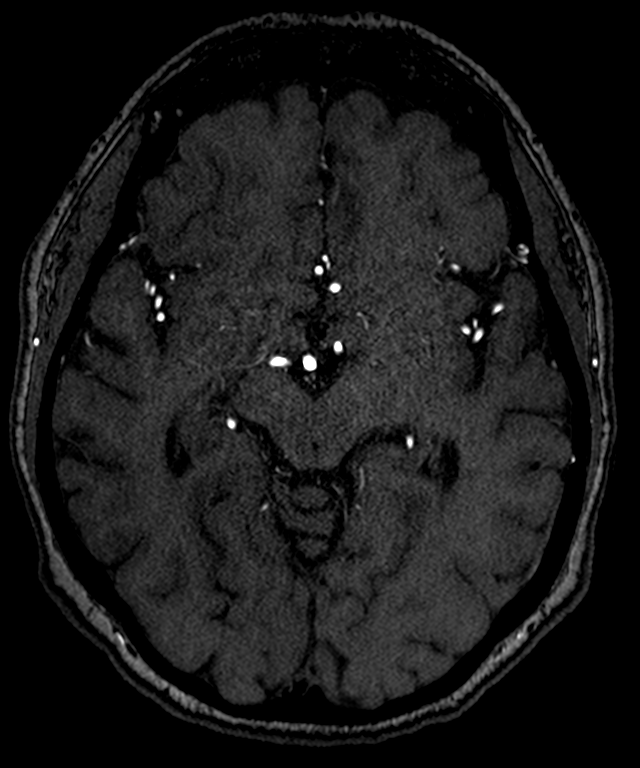
[im 149/175]
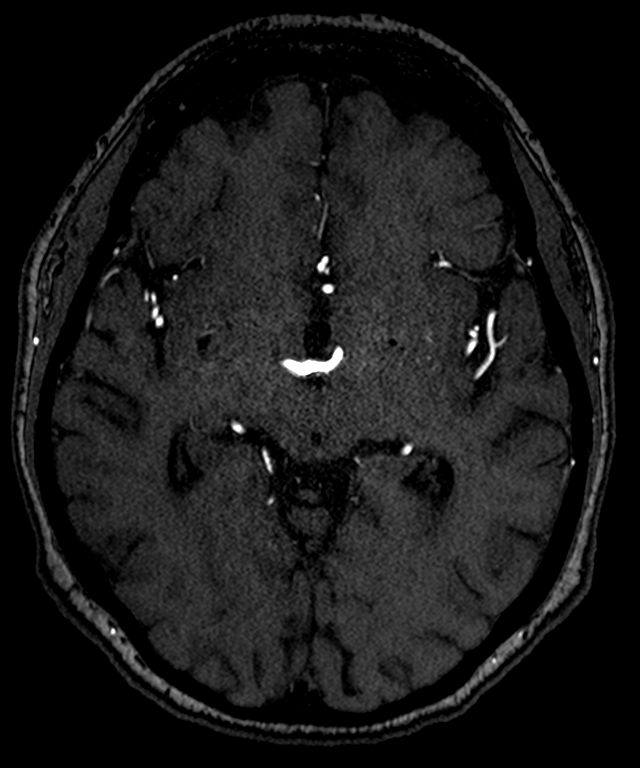
[im 167/175]
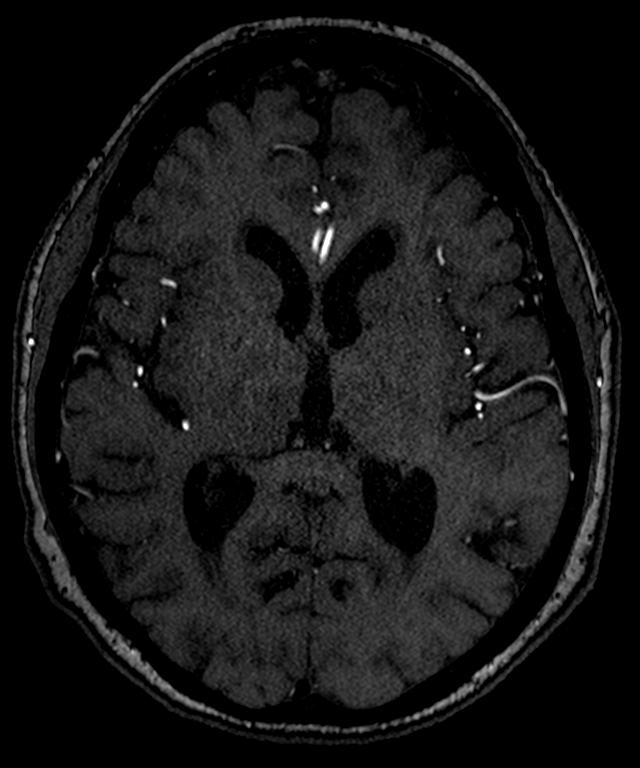

[11 of 48 positions shown; findings below may reference images not displayed]

FINDINGS: Anterior circulation: The increased tortuosity of the distal
cervical internal carotid arteries. The intracranial internal
carotid arteries are widely patent with normal flow related
enhancement. The bilateral anterior cerebral arteries and middle
cerebral arteries are widely patent with antegrade flow without
high-grade flow-limiting stenosis or proximal branch occlusion. No
intracranial aneurysm within the anterior circulation.

Posterior circulation: The vertebral arteries are widely patent with
antegrade flow. The posterior inferior cerebral arteries are normal.
Vertebrobasilar junction and basilar artery are widely patent with
antegrade flow without evidence of basilar stenosis or aneurysm.
Posterior cerebral arteries are normal bilaterally. No intracranial
aneurysm within the posterior circulation.

Anatomic variants: None significant.

Other: None.
IMPRESSION: Unremarkable MRI of the brain.

## 2021-04-10 ENCOUNTER — Other Ambulatory Visit: Payer: Self-pay | Admitting: Family Medicine

## 2021-04-10 DIAGNOSIS — Z1231 Encounter for screening mammogram for malignant neoplasm of breast: Secondary | ICD-10-CM

## 2021-05-11 ENCOUNTER — Other Ambulatory Visit: Payer: Self-pay | Admitting: Gastroenterology

## 2021-05-11 DIAGNOSIS — R1310 Dysphagia, unspecified: Secondary | ICD-10-CM

## 2021-05-15 ENCOUNTER — Other Ambulatory Visit: Payer: Self-pay | Admitting: Otolaryngology

## 2021-05-15 DIAGNOSIS — H9041 Sensorineural hearing loss, unilateral, right ear, with unrestricted hearing on the contralateral side: Secondary | ICD-10-CM

## 2021-05-16 ENCOUNTER — Ambulatory Visit
Admission: RE | Admit: 2021-05-16 | Discharge: 2021-05-16 | Disposition: A | Discharge status: Home / Self Care | Payer: MEDICARE | Source: Ambulatory Visit | Attending: Gastroenterology | Admitting: Gastroenterology

## 2021-05-16 DIAGNOSIS — R1310 Dysphagia, unspecified: Secondary | ICD-10-CM

## 2021-05-16 IMAGING — RF DG ESOPHAGUS
9 of 10 series · 15 of 24 positions shown · non-contrast
Comparison: NONE.

CLINICAL DATA: 75-year-old female with intermittent dysphagia. She
occasionally feels like solid food gets stuck in her mid chest. She
also intermittently has central chest pains.

EXAM:
ESOPHAGUS/BARIUM SWALLOW/TABLET STUDY
TECHNIQUE: Combined double and single contrast examination was performed using
effervescent crystals, high-density barium, and thin liquid barium.
A barium tablet was also administered.
FLUOROSCOPY:
Radiation Exposure Index (as provided by the fluoroscopic device):
28.6 mGy Kerma

[Series 1: fluoro_barium 2fps_bw · 2 of 7 frames shown (1 of 6)]
[frame 2/7]
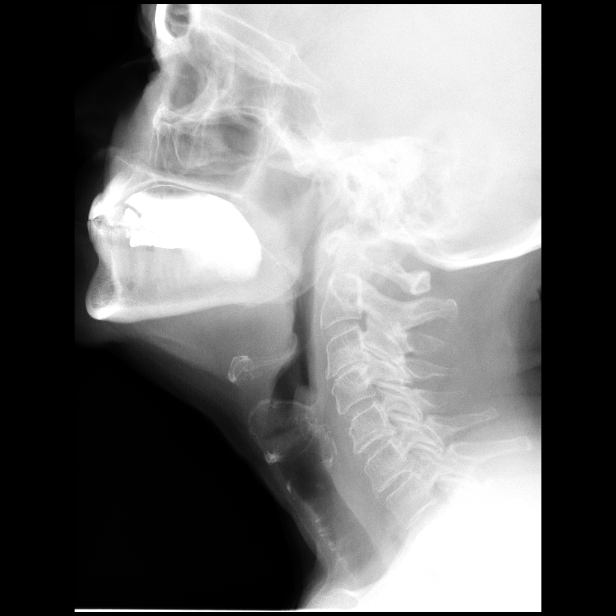
[frame 6/7]
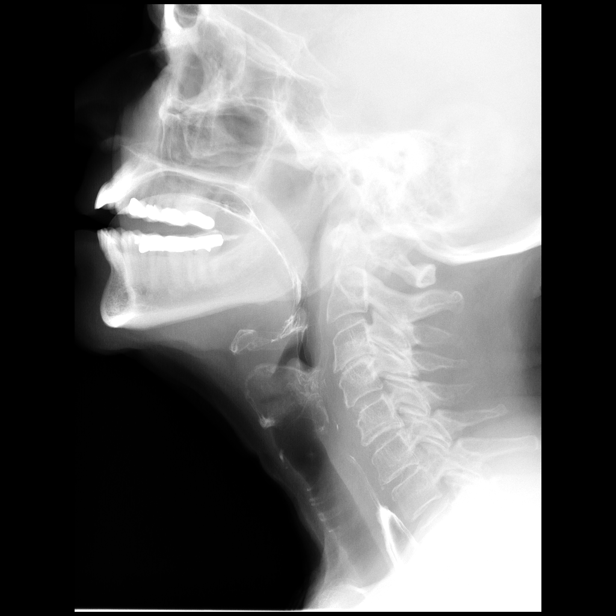

[Series 2: fluoro_barium 2fps_bw · 1 of 6 frames shown (2 of 6)]
[frame 4/6]
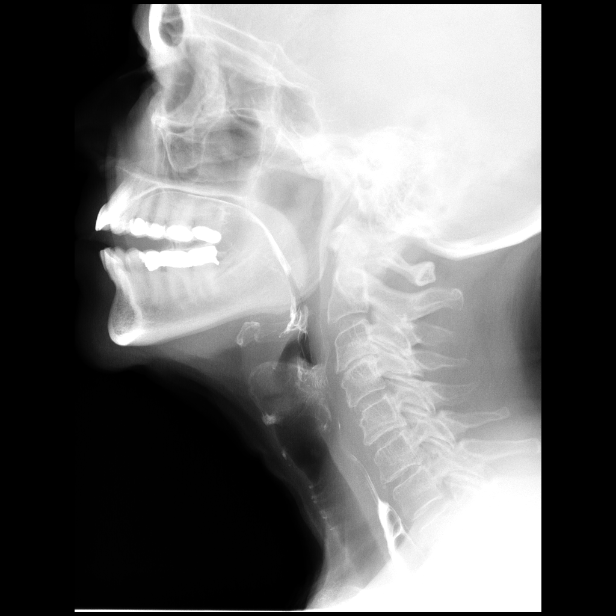

[Series 3: fluoro_barium 2fps_bw · 2 of 22 frames shown (3 of 6)]
[frame 4/22]
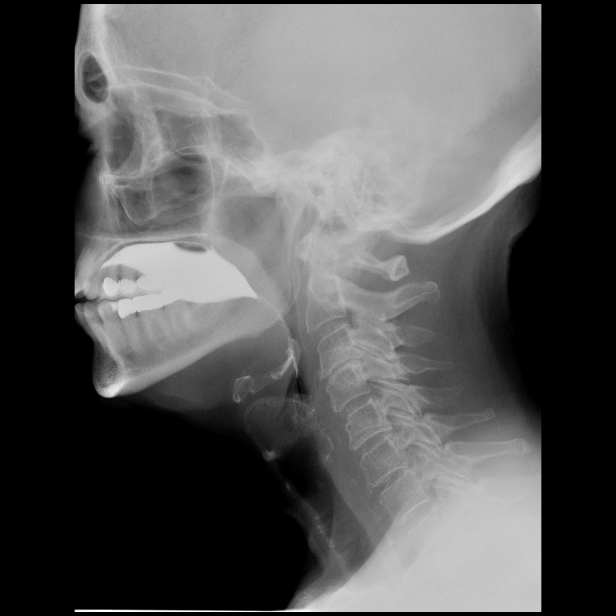
[frame 19/22]
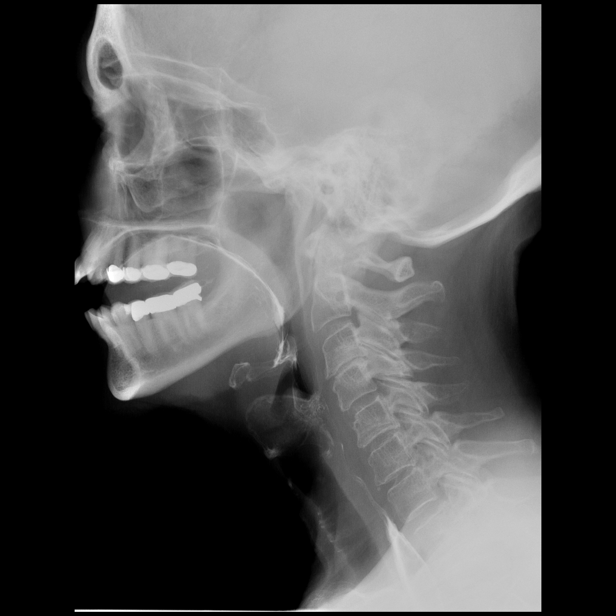

[Series 4: fluoro_barium 2fps_bw · 1 of 23 frames shown (4 of 6)]
[frame 8/23]
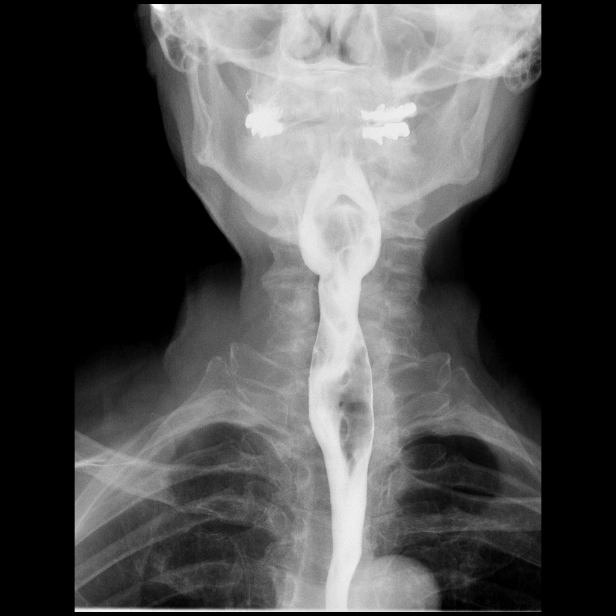

[Series 5: fluoro_barium 2fps_bw · 2 of 45 frames shown (5 of 6)]
[frame 7/45]
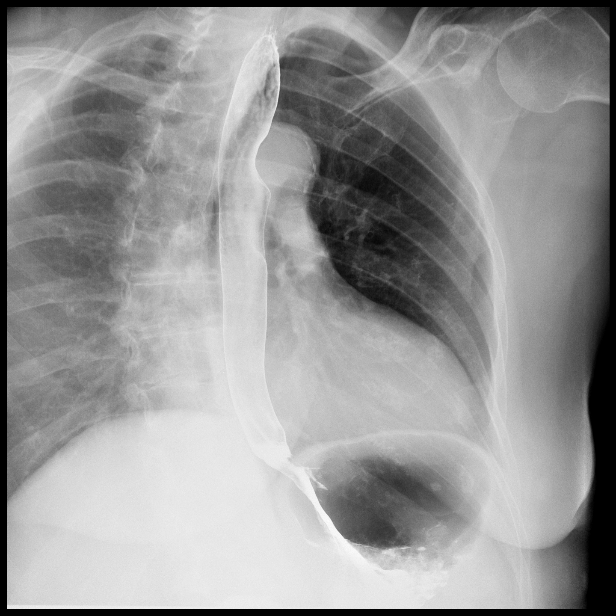
[frame 42/45]
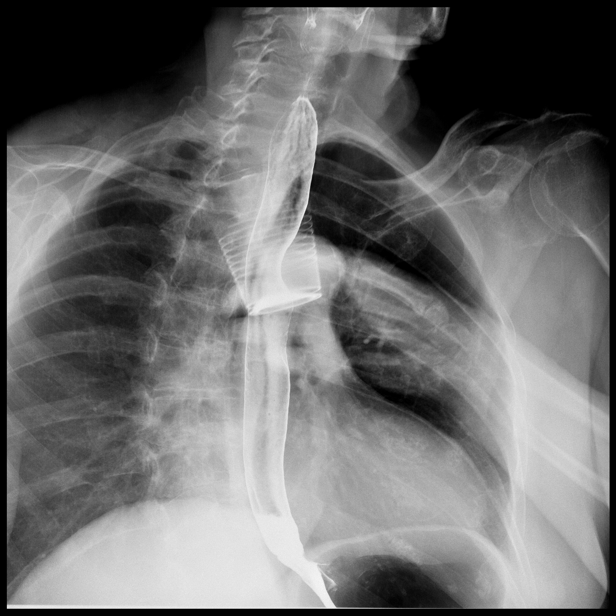

[Series 6: fluoro_barium 2fps_bw · 2 of 6 frames shown (6 of 6)]
[frame 1/6]
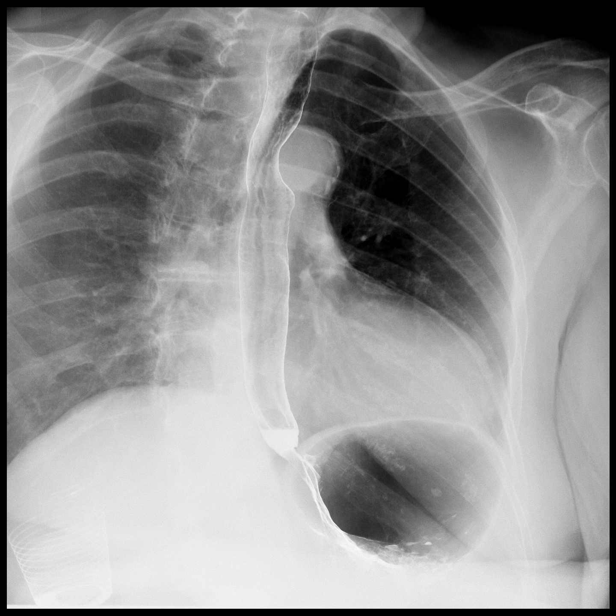
[frame 6/6]
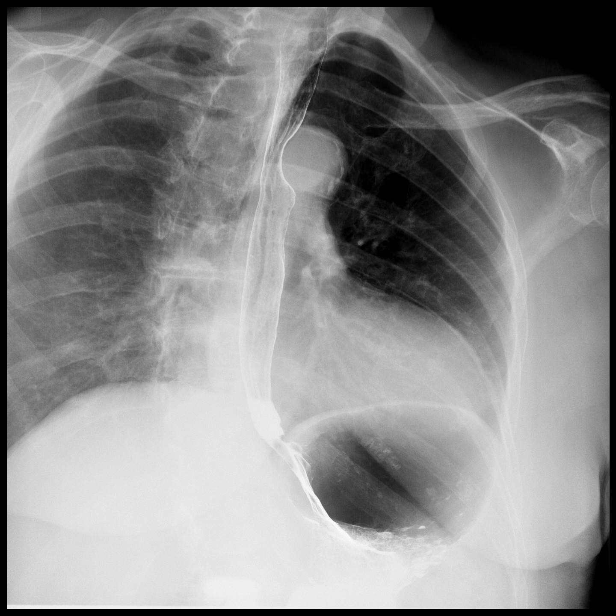

[Series 8: cp_standard · 2 of 123 frames shown (1 of 3)]
[frame 19/123]
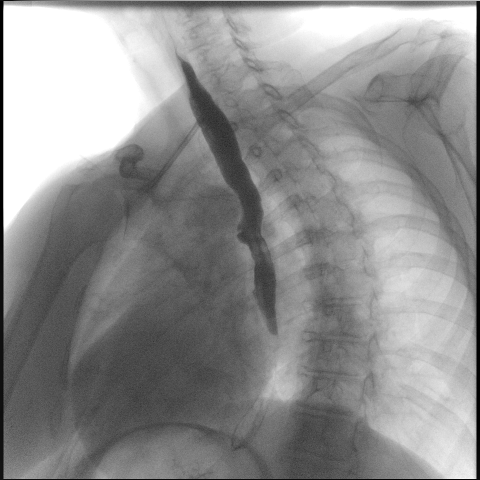
[frame 107/123]
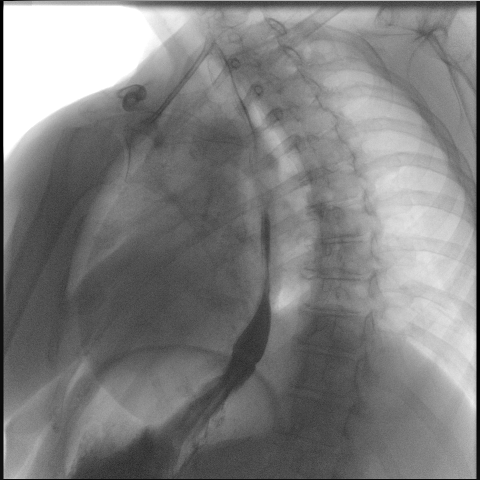

[Series 11: cp_standard · 1 of 1 slices shown (2 of 3)]
[im 1/1]
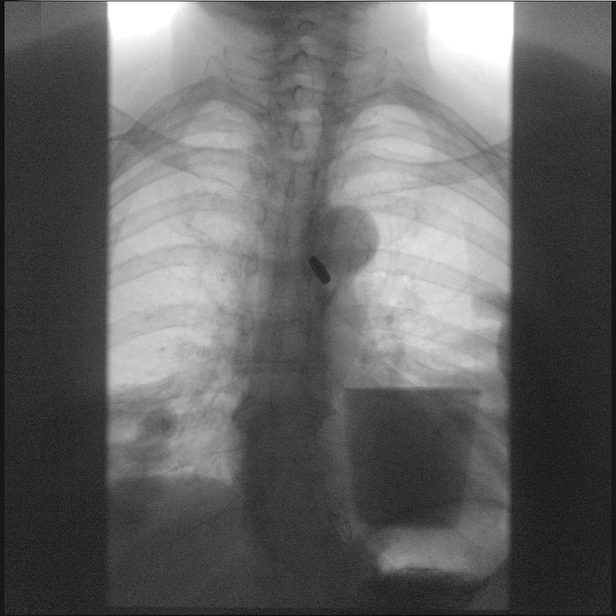

[Series 13: cp_standard · 2 of 85 frames shown (3 of 3)]
[frame 13/85]
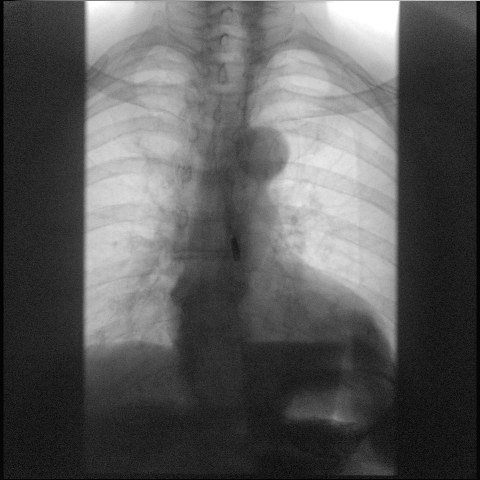
[frame 73/85]
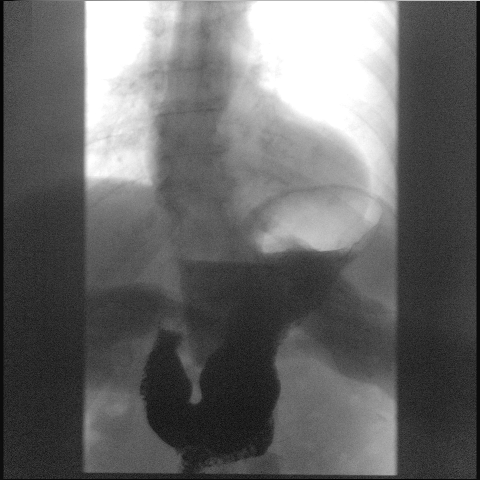

[15 of 24 positions shown; findings below may reference images not displayed]

FINDINGS: Swallowing: Appears normal. No vestibular penetration or aspiration
seen.

Pharynx: Unremarkable.

Esophagus: Normal appearance for age. There is a C shaped
indentation on the left aspect of the upper thoracic esophagus where
there is external compression from the adjacent tortuous thoracic
aorta.

Esophageal motility: Within normal limits.

Hiatal Hernia: None.

Gastroesophageal reflux: None visualized.

Ingested 13 mm barium tablet: The barium tablet became stuck as the
esophagus passes around the aortic curvature. The tablet was
successfully cleared after 2 additional sips of water.

Other: None.
IMPRESSION: 1. Mildly tortuous thoracic aorta resulting in some external
compression of the upper thoracic esophagus. While often a normal
finding, there was hang up of the barium tablet at this location
which replicated the patient's clinical symptoms. The barium tablet
was successfully passed with 2 additional sips of water. Imaging
combined with replication of clinical symptoms is consistent with
dysphagia aortic a. Of note, there is no aneurysmal dilation of the
aortic shadow.
2. Otherwise, normal esophagus and esophageal motility.

## 2021-06-02 ENCOUNTER — Ambulatory Visit
Admission: RE | Admit: 2021-06-02 | Discharge: 2021-06-02 | Disposition: A | Discharge status: Home / Self Care | Payer: MEDICARE | Source: Ambulatory Visit | Attending: Otolaryngology | Admitting: Otolaryngology

## 2021-06-02 DIAGNOSIS — H9041 Sensorineural hearing loss, unilateral, right ear, with unrestricted hearing on the contralateral side: Secondary | ICD-10-CM

## 2021-06-02 IMAGING — MR MR HEAD WO/W CM
12 of 13 series · 45 of 48 positions shown · IV contrast (multihance)
Comparison: CT head [DATE].

CLINICAL DATA: Right-sided hearing loss.

EXAM:
MRI HEAD WITHOUT AND WITH CONTRAST
TECHNIQUE: Multiplanar, multiecho pulse sequences of the brain and surrounding
structures were obtained without and with intravenous contrast.
CONTRAST:  15mL MULTIHANCE GADOBENATE DIMEGLUMINE 529 MG/ML IV SOLN

[Series 5: T1 · sagittal · 4.0mm · 0.72mm/px · 2 of 29 slices shown (1 of 3)]
[im 1/29]
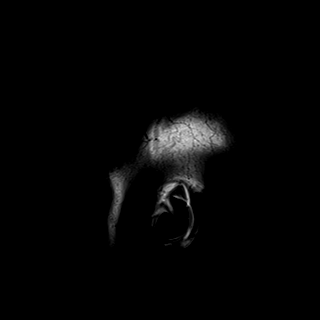
[im 29/29]
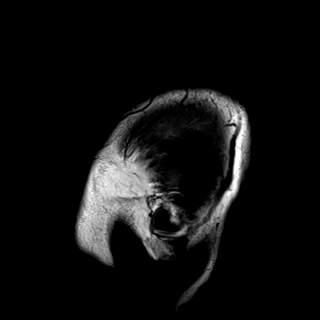

[Series 6: DWI · axial · 3.0mm · 0.94mm/px · z∈[-62,+99]mm · 12 of 184 slices shown]
[im 1/184]
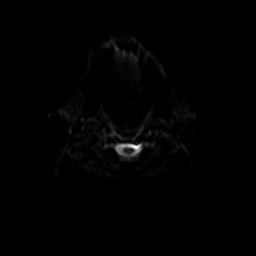
[im 17/184]
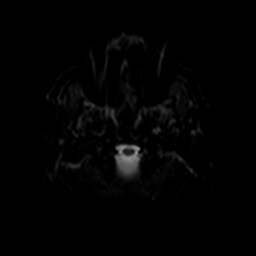
[im 34/184]
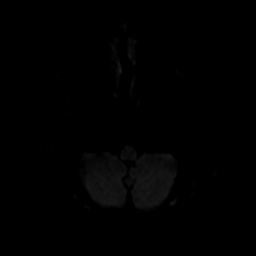
[im 50/184]
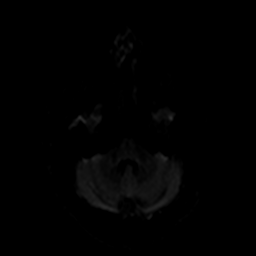
[im 67/184]
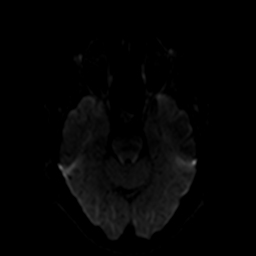
[im 84/184]
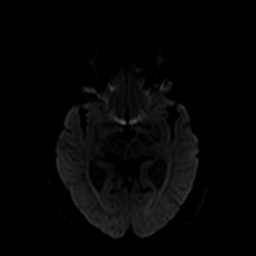
[im 100/184]
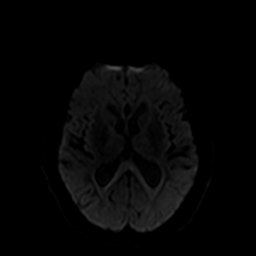
[im 117/184]
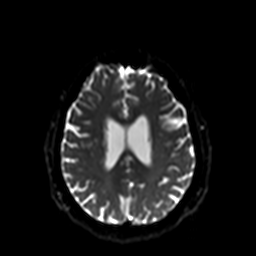
[im 134/184]
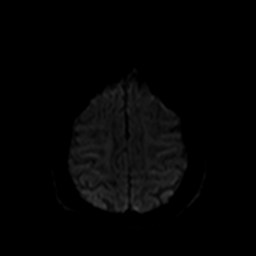
[im 150/184]
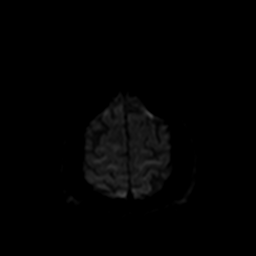
[im 167/184]
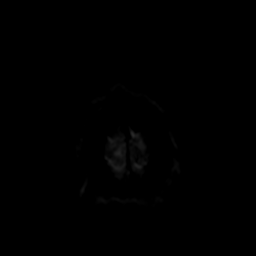
[im 184/184]
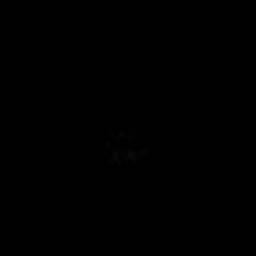

[Series 7: ax dwi_tracew · axial · 3.0mm · 0.94mm/px · z∈[-62,+99]mm · 6 of 92 slices shown]
[im 1/92]
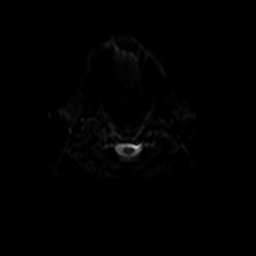
[im 19/92]
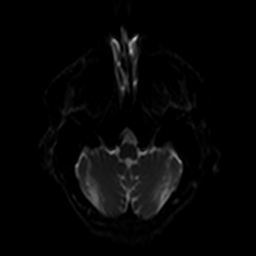
[im 37/92]
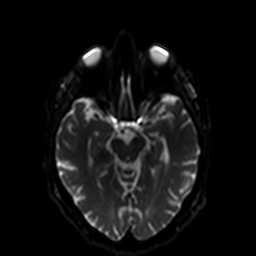
[im 55/92]
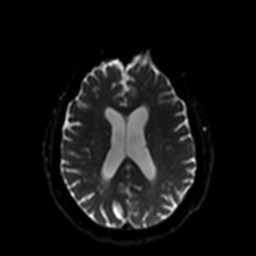
[im 73/92]
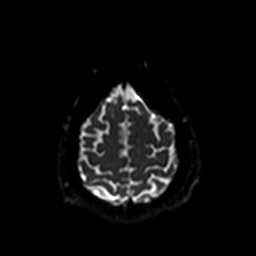
[im 92/92]
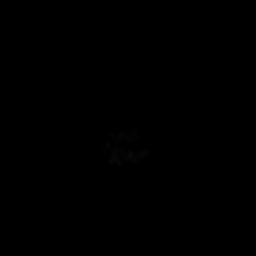

[Series 8: ax dwi_adc · axial · 3.0mm · 0.94mm/px · z∈[-62,+99]mm · 3 of 46 slices shown]
[im 1/46]
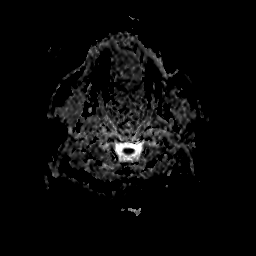
[im 23/46]
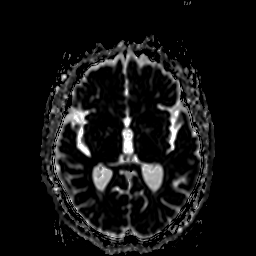
[im 46/46]
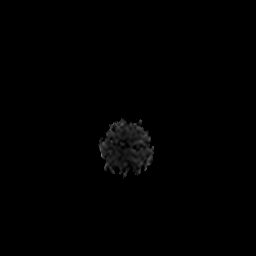

[Series 9: T2 · axial · 4.0mm · 0.36mm/px · z∈[-50,+108]mm · 2 of 32 slices shown]
[im 1/32]
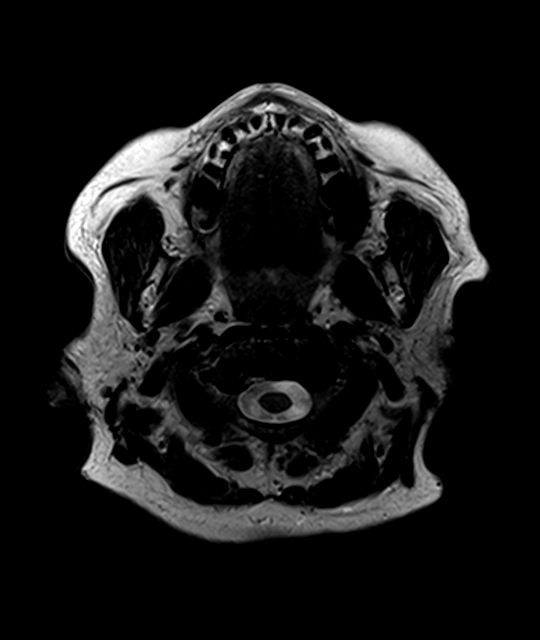
[im 32/32]
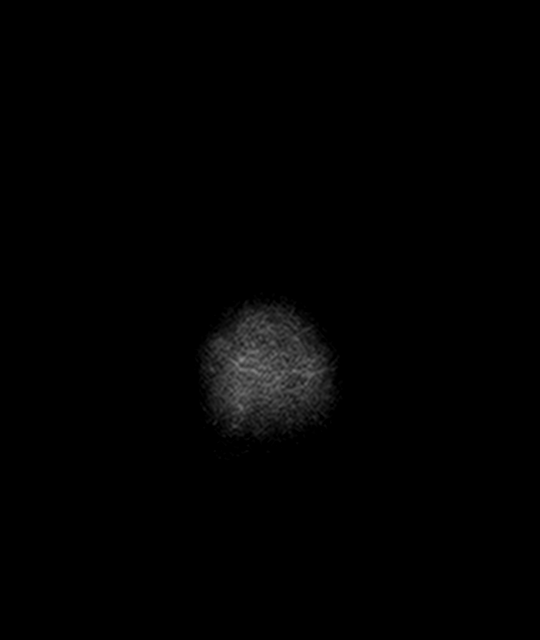

[Series 10: FLAIR · axial · 3.0mm · 0.72mm/px · z∈[-50,+109]mm · 2 of 28 slices shown]
[im 1/28]
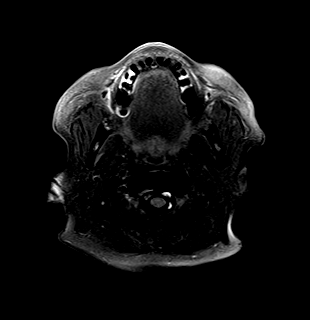
[im 28/28]
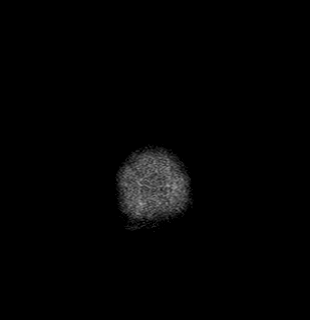

[Series 12: swi_images · axial · 3.0mm · 0.90mm/px · z∈[-47,+104]mm · 3 of 52 slices shown]
[im 1/52]
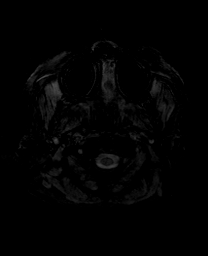
[im 26/52]
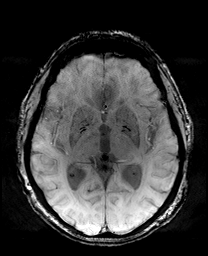
[im 52/52]
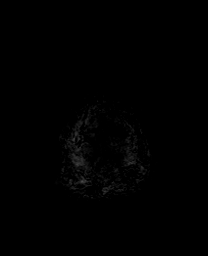

[Series 13: T1 · coronal · 3.0mm · 0.56mm/px · 1 of 13 slices shown (2 of 3)]
[im 1/13]
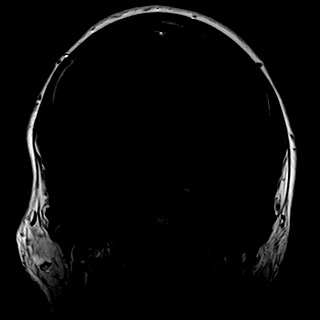

[Series 15: T1 · axial · 3.0mm · 0.50mm/px · 1 of 13 slices shown (3 of 3)]
[im 1/13]
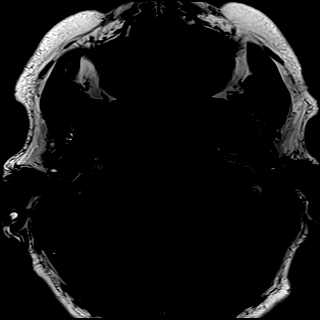

[Series 16: T1 post-contrast · coronal · 3.0mm · 0.56mm/px · 1 of 13 slices shown (1 of 3)]
[im 1/13]
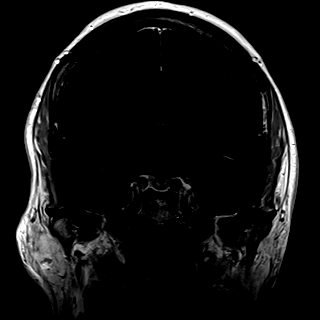

[Series 17: T1 post-contrast · axial · 3.0mm · 0.50mm/px · 1 of 13 slices shown (2 of 3)]
[im 1/13]
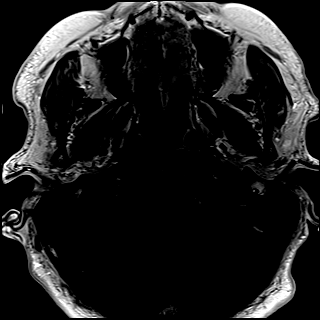

[Series 18: T1 post-contrast · axial · 1.0mm · 0.90mm/px · z∈[-50,+106]mm · 11 of 160 slices shown (3 of 3)]
[im 1/160]
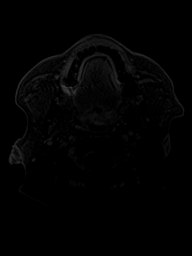
[im 16/160]
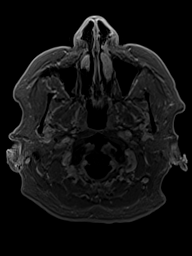
[im 32/160]
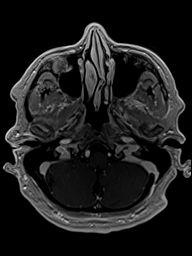
[im 48/160]
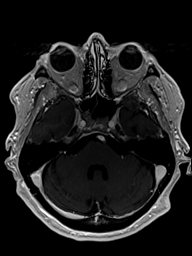
[im 64/160]
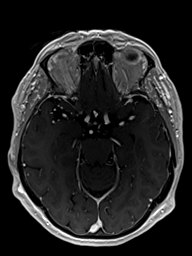
[im 80/160]
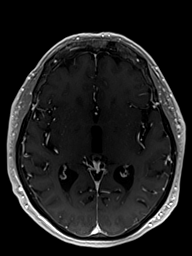
[im 96/160]
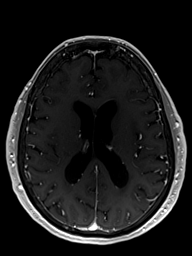
[im 112/160]
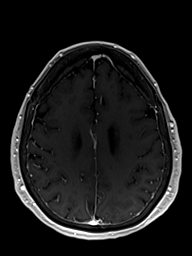
[im 128/160]
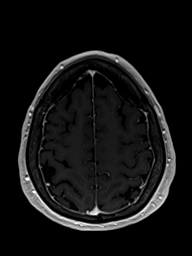
[im 144/160]
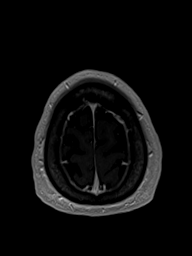
[im 160/160]
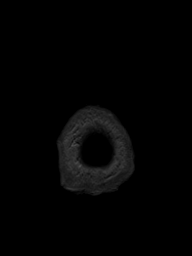

[45 of 48 positions shown; findings below may reference images not displayed]

FINDINGS: Brain: No acute infarction, hemorrhage, hydrocephalus, extra-axial
collection or mass lesion. Mild for age scattered T2/FLAIR
hyperintensities in the white matter, nonspecific but compatible
with chronic microvascular ischemic disease. No pathologic
enhancement.

Dedicated IAC protocol was performed. Unremarkable appearance of the
visualized seventh and eighth cranial nerves bilaterally. Normal CSF
signal within the labyrinth bilaterally. No abnormal enhancement or
retrocochlear mass.

Vascular: Major arterial flow voids are maintained skull base.

Skull and upper cervical spine: Normal marrow signal.

Sinuses/Orbits: Largely clear sinuses.  No acute orbital findings.

Other: No sizable mastoid effusions.
IMPRESSION: No evidence of acute intracranial abnormality or retrocochlear mass.

## 2021-06-02 MED ORDER — GADOBENATE DIMEGLUMINE 529 MG/ML IV SOLN
15.0000 mL | Freq: Once | INTRAVENOUS | Status: AC | PRN
  Administered 2021-06-02: 15 mL via INTRAVENOUS

## 2021-09-04 ENCOUNTER — Ambulatory Visit: Payer: MEDICARE

## 2021-09-04 ENCOUNTER — Emergency Department
Admission: EM | Admit: 2021-09-04 | Discharge: 2021-09-04 | Discharge status: Against Medical Advice | Payer: Medicare Other | Attending: Emergency Medicine | Admitting: Emergency Medicine

## 2021-09-04 ENCOUNTER — Inpatient Hospital Stay: Admission: RE | Admit: 2021-09-04 | Payer: MEDICARE | Source: Ambulatory Visit

## 2021-09-04 ENCOUNTER — Emergency Department: Payer: Medicare Other

## 2021-09-04 ENCOUNTER — Other Ambulatory Visit: Payer: Self-pay

## 2021-09-04 DIAGNOSIS — R110 Nausea: Secondary | ICD-10-CM | POA: Diagnosis not present

## 2021-09-04 DIAGNOSIS — R079 Chest pain, unspecified: Secondary | ICD-10-CM | POA: Insufficient documentation

## 2021-09-04 DIAGNOSIS — Z5321 Procedure and treatment not carried out due to patient leaving prior to being seen by health care provider: Secondary | ICD-10-CM | POA: Insufficient documentation

## 2021-09-04 DIAGNOSIS — R1011 Right upper quadrant pain: Secondary | ICD-10-CM | POA: Insufficient documentation

## 2021-09-04 LAB — COMPREHENSIVE METABOLIC PANEL
ALT: 15 U/L (ref 0–44)
AST: 18 U/L (ref 15–41)
Albumin: 4.1 g/dL (ref 3.5–5.0)
Alkaline Phosphatase: 32 U/L — ABNORMAL LOW (ref 38–126)
Anion gap: 7 (ref 5–15)
BUN: 18 mg/dL (ref 8–23)
CO2: 22 mmol/L (ref 22–32)
Calcium: 9.1 mg/dL (ref 8.9–10.3)
Chloride: 113 mmol/L — ABNORMAL HIGH (ref 98–111)
Creatinine, Ser: 0.78 mg/dL (ref 0.44–1.00)
GFR, Estimated: >60 mL/min (ref >60)
Glucose, Bld: 102 mg/dL — ABNORMAL HIGH (ref 70–99)
Potassium: 3.7 mmol/L (ref 3.5–5.1)
Sodium: 142 mmol/L (ref 135–145)
Total Bilirubin: 1 mg/dL (ref 0.3–1.2)
Total Protein: 6.6 g/dL (ref 6.5–8.1)

## 2021-09-04 LAB — CBC
HCT: 26.9 % — ABNORMAL LOW (ref 36.0–46.0)
Hemoglobin: 8.8 g/dL — ABNORMAL LOW (ref 12.0–15.0)
MCH: 33.1 pg (ref 26.0–34.0)
MCHC: 32.7 g/dL (ref 30.0–36.0)
MCV: 101.1 fL — ABNORMAL HIGH (ref 80.0–100.0)
Platelets: 372 10*3/uL (ref 150–400)
RBC: 2.66 MIL/uL — ABNORMAL LOW (ref 3.87–5.11)
RDW: 14.7 % (ref 11.5–15.5)
WBC: 5.1 10*3/uL (ref 4.0–10.5)
nRBC: 0.4 % — ABNORMAL HIGH (ref 0.0–0.2)

## 2021-09-04 LAB — LIPASE, BLOOD: Lipase: 34 U/L (ref 11–51)

## 2021-09-04 LAB — TROPONIN I (HIGH SENSITIVITY)
Troponin I (High Sensitivity): 2 ng/L (ref <18)
Troponin I (High Sensitivity): 3 ng/L (ref <18)

## 2021-09-04 NOTE — ED Notes (Signed)
Pt states leaving. Will follow up with PCP or return if symptoms worsen. 2nd troponin drew prior to leaving.

## 2021-09-04 NOTE — ED Triage Notes (Addendum)
First RN note:  Per EMS, Pt, from home, c/o R side chest pain and nausea starting this afternoon.  Pain subsided prior to EMS arrival.  Pt took 324mg  aspirin prior to EMS arrival as well.     Vitals: 125/84, HR 77, 99% RA, CBG 171    EKG: Unremarkable

## 2021-09-04 NOTE — ED Provider Triage Note (Signed)
Emergency Medicine Provider Triage Evaluation Note  Wendy Solis , a 76 y.o. adult  was evaluated in triage.  Pt complains of right upper quadrant and chest pain.  Patient ate peanut butter this morning that started right-sided chest pain with some nausea.  Review of Systems  Positive: Right upper pain, right-sided chest pain Negative: Fever chills  Physical Exam  There were no vitals taken for this visit. Gen:   Awake, no distress   Resp:  Normal effort  MSK:   Moves extremities without difficulty  Other:  Patient is tender right upper  Medical Decision Making  Medically screening exam initiated at 3:57 PM.  Appropriate orders placed.  Wendy Solis was informed that the remainder of the evaluation will be completed by another provider, this initial triage assessment does not replace that evaluation, and the importance of remaining in the ED until their evaluation is complete.  Instructed nursing staff to continue with cardiac involvement but will also add work-up for cholecystitis   Faythe Ghee, PA-C 09/04/21 1558

## 2021-10-09 ENCOUNTER — Ambulatory Visit
Admission: RE | Admit: 2021-10-09 | Discharge: 2021-10-09 | Disposition: A | Discharge status: Home / Self Care | Payer: MEDICARE | Source: Ambulatory Visit | Attending: Family Medicine | Admitting: Family Medicine

## 2021-10-09 DIAGNOSIS — Z1231 Encounter for screening mammogram for malignant neoplasm of breast: Secondary | ICD-10-CM

## 2022-03-20 ENCOUNTER — Other Ambulatory Visit: Payer: Self-pay | Admitting: Family Medicine

## 2022-03-20 DIAGNOSIS — M8580 Other specified disorders of bone density and structure, unspecified site: Secondary | ICD-10-CM

## 2022-06-25 ENCOUNTER — Ambulatory Visit
Admission: EM | Admit: 2022-06-25 | Discharge: 2022-06-25 | Disposition: A | Discharge status: Home / Self Care | Payer: MEDICARE | Attending: Emergency Medicine | Admitting: Emergency Medicine

## 2022-06-25 ENCOUNTER — Encounter: Payer: Self-pay | Admitting: Emergency Medicine

## 2022-06-25 DIAGNOSIS — B029 Zoster without complications: Secondary | ICD-10-CM

## 2022-06-25 MED ORDER — VALACYCLOVIR HCL 1 G PO TABS: 1000.0000 mg | ORAL_TABLET | Freq: Three times a day (TID) | ORAL | 0 refills | Status: AC

## 2022-06-25 NOTE — ED Triage Notes (Signed)
Pt presents with a painful rash on her back x 5 days.

## 2022-06-25 NOTE — Discharge Instructions (Signed)
Take the valtrex 3 times a day for 7 days.  Keep the lesions covered by clothing.  Avoid contact with small children who have not had the Chicken Pox vaccine, pregnant individuals, or anyone who is immunocompromised.

## 2022-06-25 NOTE — ED Provider Notes (Signed)
[facility]    CSN: [id number] Arrival date & time: 06/25/22  1031      History   Chief Complaint Chief Complaint  Patient presents with   Rash    HPI Wendy Solis is a 77 y.o. adult.   HPI  77 year old female with a past medical history significant for hyperlipidemia, myelodysplastic anemia, and GERD presents for evaluation of an itchy burning rash that appeared on her back approximately 5 days ago.  She states she thinks there might be some blisters forming but she is unsure.  The patient states that she had the first shingles vaccine years ago but she has not had the Shingrix.  She also denies any previous history of shingles.  History reviewed. No pertinent past medical history.  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 No pertinent surgical history.     Home Medications    Prior to Admission medications   Medication Sig Start Date End Date Taking? Authorizing Provider  valACYclovir (VALTREX) 1000 MG tablet Take 1 tablet (1,000 mg total) by mouth 3 (three) times daily. 06/25/22  Yes Becky Augusta, NP    Family History Family History  Problem Relation Age of Onset   Breast cancer Neg Hx     Social History Social History   Tobacco Use   Smoking status: Never   Smokeless tobacco: Never  Vaping Use   Vaping Use: Never used  Substance Use Topics   Alcohol use: Yes    Comment: social   Drug use: Never     Allergies   Atorvastatin, Cyclobenzaprine, Sulfonamide derivatives, and Tizanidine   Review of Systems Review of Systems  Constitutional:  Negative for fever.  Skin:  Positive for rash.     Physical Exam Triage Vital Signs ED Triage Vitals  Enc Vitals Group     BP 06/25/22 1200 127/76     Pulse Rate 06/25/22 1200 67     Resp 06/25/22 1200 16     Temp 06/25/22  1200 (!) 97.5 F (36.4 C)     Temp Source 06/25/22 1200 Oral     SpO2 06/25/22 1200 97 %     Weight --      Height --      Head Circumference --      Peak Flow --      Pain Score 06/25/22 1158 0     Pain Loc --      Pain Edu? --      Excl. in GC? --    No data found.  Updated Vital Signs BP 127/76 (BP Location: Right Arm)   Pulse 67   Temp (!) 97.5 F (36.4 C) (Oral)   Resp 16   SpO2 97%   Visual Acuity Right Eye Distance:   Left Eye Distance:   Bilateral Distance:    Right Eye Near:   Left Eye Near:    Bilateral Near:     Physical Exam Vitals and nursing note reviewed.  Constitutional:      Appearance: Normal appearance. He is not ill-appearing.  Skin:    General: Skin is warm and dry.     Capillary Refill: Capillary refill takes less than 2 seconds.     Findings: Erythema and rash present.     Comments: Patient has 3 distinct patches of erythematous  vesicles on her right upper back and shoulder.  None are fluid-filled.  Neurological:     General: No focal deficit present.     Mental Status: He is alert and oriented to person, place, and time.      UC Treatments / Results  Labs (all labs ordered are listed, but only abnormal results are displayed) Labs Reviewed - No data to display  EKG   Radiology No results found.  Procedures Procedures (including critical care time)  Medications Ordered in UC Medications - No data to display  Initial Impression / Assessment and Plan / UC Course  I have reviewed the triage vital signs and the nursing notes.  Pertinent labs & imaging results that were available during my care of the patient were reviewed by me and considered in my medical decision making (see chart for details).   Patient is a very pleasant, nontoxic-appearing 77 year old female presenting for evaluation of an itching burning rash on her right upper back as outlined in HPI above.  Patient seen image above there are 3 separate islands of  erythematous maculopapular lesions but no vesicles that are consistent with herpes zoster outbreak.  I will treat the patient with Valtrex 1000 mg 3 times a day for a week.  I advised her to keep the lesions covered and avoid contact with small children who have not received the chickenpox vaccine, those were immunocompromised, and those were pregnant.  Return precautions reviewed.  Final Clinical Impressions(s) / UC Diagnoses   Final diagnoses:  Herpes zoster without complication     Discharge Instructions      Take the valtrex 3 times a day for 7 days.  Keep the lesions covered by clothing.  Avoid contact with small children who have not had the Chicken Pox vaccine, pregnant individuals, or anyone who is immunocompromised.       ED Prescriptions     Medication Sig Dispense Auth. Provider   valACYclovir (VALTREX) 1000 MG tablet Take 1 tablet (1,000 mg total) by mouth 3 (three) times daily. 21 tablet Becky Augusta, NP      PDMP not reviewed this encounter.   Becky Augusta, NP 06/25/22 1222

## 2022-08-22 ENCOUNTER — Inpatient Hospital Stay: Admission: RE | Admit: 2022-08-22 | Payer: Medicare Other | Source: Ambulatory Visit

## 2022-09-25 ENCOUNTER — Other Ambulatory Visit: Payer: Self-pay | Admitting: Family Medicine

## 2022-09-25 DIAGNOSIS — D469 Myelodysplastic syndrome, unspecified: Secondary | ICD-10-CM

## 2022-09-25 DIAGNOSIS — E785 Hyperlipidemia, unspecified: Secondary | ICD-10-CM

## 2022-09-28 ENCOUNTER — Ambulatory Visit
Admission: RE | Admit: 2022-09-28 | Discharge: 2022-09-28 | Disposition: A | Discharge status: Home / Self Care | Payer: MEDICARE | Source: Ambulatory Visit | Attending: Family Medicine | Admitting: Family Medicine

## 2022-09-28 DIAGNOSIS — D469 Myelodysplastic syndrome, unspecified: Secondary | ICD-10-CM

## 2022-09-28 DIAGNOSIS — E785 Hyperlipidemia, unspecified: Secondary | ICD-10-CM | POA: Insufficient documentation

## 2022-10-01 ENCOUNTER — Ambulatory Visit: Payer: MEDICARE

## 2023-02-26 ENCOUNTER — Ambulatory Visit
Admission: RE | Admit: 2023-02-26 | Discharge: 2023-02-26 | Disposition: A | Discharge status: Home / Self Care | Payer: MEDICARE | Source: Ambulatory Visit | Attending: Family Medicine | Admitting: Family Medicine

## 2023-02-26 DIAGNOSIS — M8580 Other specified disorders of bone density and structure, unspecified site: Secondary | ICD-10-CM

## 2023-03-26 ENCOUNTER — Other Ambulatory Visit: Payer: Self-pay | Admitting: Family Medicine

## 2023-03-26 DIAGNOSIS — N6323 Unspecified lump in the left breast, lower outer quadrant: Secondary | ICD-10-CM

## 2023-04-10 ENCOUNTER — Ambulatory Visit
Admission: RE | Admit: 2023-04-10 | Discharge: 2023-04-10 | Disposition: A | Discharge status: Home / Self Care | Payer: MEDICARE | Source: Ambulatory Visit | Attending: Family Medicine | Admitting: Family Medicine

## 2023-04-10 ENCOUNTER — Other Ambulatory Visit: Payer: Self-pay | Admitting: Family Medicine

## 2023-04-10 DIAGNOSIS — N6323 Unspecified lump in the left breast, lower outer quadrant: Secondary | ICD-10-CM

## 2023-04-10 DIAGNOSIS — N6320 Unspecified lump in the left breast, unspecified quadrant: Secondary | ICD-10-CM

## 2023-04-17 ENCOUNTER — Ambulatory Visit
Admission: RE | Admit: 2023-04-17 | Discharge: 2023-04-17 | Disposition: A | Discharge status: Home / Self Care | Payer: MEDICARE | Source: Ambulatory Visit | Attending: Family Medicine | Admitting: Family Medicine

## 2023-04-17 DIAGNOSIS — N6320 Unspecified lump in the left breast, unspecified quadrant: Secondary | ICD-10-CM

## 2023-04-17 HISTORY — PX: BREAST BIOPSY: SHX20

## 2023-04-18 LAB — SURGICAL PATHOLOGY

## 2023-04-19 ENCOUNTER — Telehealth: Payer: Self-pay | Admitting: *Deleted

## 2023-04-19 NOTE — Telephone Encounter (Signed)
 No voicemail set up, sending packet in reference to [facility] appointment on 3/12

## 2023-04-22 ENCOUNTER — Encounter: Payer: Self-pay | Admitting: *Deleted

## 2023-04-22 ENCOUNTER — Telehealth: Payer: Self-pay | Admitting: *Deleted

## 2023-04-22 DIAGNOSIS — C50412 Malignant neoplasm of upper-outer quadrant of left female breast: Secondary | ICD-10-CM | POA: Insufficient documentation

## 2023-04-22 NOTE — Progress Notes (Signed)
 Radiation Oncology         [phone] ________________________________  Multidisciplinary Breast Oncology Clinic [facility]) Initial Outpatient Consultation  Name: Wendy Solis MRN: [id number]  Date: 04/24/2023  DOB: 1945/09/16  CC:Wendy Montana, MD  Wendy Miner, MD   REFERRING PHYSICIAN: Chevis Pretty III, MD  DIAGNOSIS: There were no encounter diagnoses.  Stage *** Left Breast UOQ, Invasive Mammary Carcinoma, ER+ / PR+ / Her2-, Grade 2   No diagnosis found.  HISTORY OF PRESENT ILLNESS::Wendy Solis is a 78 y.o. adult who is presenting to the office today for evaluation of her newly diagnosed breast cancer. She is accompanied by ***. She is doing well overall.   Patient presented with left breast thickening identified on clinical exam. She underwent bilateral diagnostic mammography with tomography and breast ultrasonography on 04/10/2023 showing: An indeterminate 1.9 cm outer left breast mass corresponding to the patient's palpable abnormality.  No abnormal appearing left axillary lymph nodes were visualized.  Biopsy on 04/17/2023 showed: Intermediate grade invasive mammary carcinoma. Prognostic indicators significant for: estrogen receptor, 90% positive and progesterone receptor, 95% positive, both with strong staining intensity. Proliferation marker Ki67 at 3%. HER2 negative.  Menarche: *** years old Age at first live birth: *** years old GP: *** LMP: *** Contraceptive: *** HRT: ***   The patient was referred today for presentation in the multidisciplinary conference.  Radiology studies and pathology slides were presented there for review and discussion of treatment options.  A consensus was discussed regarding potential next steps.  PREVIOUS RADIATION THERAPY: {EXAM; YES/NO:19492::"No"}  PAST MEDICAL HISTORY: No past medical history on file.  PAST SURGICAL HISTORY: Past Surgical History:  Procedure Laterality Date   BREAST BIOPSY Left 04/17/2023   [place] LT BREAST  BX W LOC DEV 1ST LESION IMG BX SPEC US GUIDE 04/17/2023 [facility] MAMMOGRAPHY    FAMILY HISTORY:  Family History  Problem Relation Age of Onset   Breast cancer Neg Hx     SOCIAL HISTORY:  Social History   Socioeconomic History   Marital status: Married    Spouse name: Not on file   Number of children: Not on file   Years of education: Not on file   Highest education level: Not on file  Occupational History   Not on file  Tobacco Use   Smoking status: Never   Smokeless tobacco: Never  Vaping Use   Vaping status: Never Used  Substance and Sexual Activity   Alcohol use: Yes    Comment: social   Drug use: Never   Sexual activity: Not on file  Other Topics Concern   Not on file  Social History Narrative   Not on file   Social Drivers of Health   Financial Resource Strain: Not on file  Food Insecurity: Not on file  Transportation Needs: Not on file  Physical Activity: Not on file  Stress: Not on file  Social Connections: Not on file    ALLERGIES:  Allergies  Allergen Reactions   Atorvastatin     Other reaction(s): myalgia  Other reaction(s): myalgia   Cyclobenzaprine     Other reaction(s): face swelled  Other reaction(s): face swelled   Sulfonamide Derivatives    Tizanidine     Other reaction(s): nausea    MEDICATIONS:  Current Outpatient Medications  Medication Sig Dispense Refill   valACYclovir (VALTREX) 1000 MG tablet Take 1 tablet (1,000 mg total) by mouth 3 (three) times daily. 21 tablet 0   No current facility-administered medications for this visit.  REVIEW OF SYSTEMS: A 10+ POINT REVIEW OF SYSTEMS WAS OBTAINED including neurology, dermatology, psychiatry, cardiac, respiratory, lymph, extremities, GI, GU, musculoskeletal, constitutional, reproductive, HEENT. On the provided form, she reports ***. She denies *** and any other symptoms.    PHYSICAL EXAM:  vitals were not taken for this visit.  {may need to copy over vitals} Lungs are clear to  auscultation bilaterally. Heart has regular rate and rhythm. No palpable cervical, supraclavicular, or axillary adenopathy. Abdomen soft, non-tender, normal bowel sounds. Breast: *** breast with no palpable mass, nipple discharge, or bleeding. *** breast with ***.   KPS = ***  100 - Normal; no complaints; no evidence of disease. 90   - Able to carry on normal activity; minor signs or symptoms of disease. 80   - Normal activity with effort; some signs or symptoms of disease. 25   - Cares for self; unable to carry on normal activity or to do active work. 60   - Requires occasional assistance, but is able to care for most of his personal needs. 50   - Requires considerable assistance and frequent medical care. 40   - Disabled; requires special care and assistance. 30   - Severely disabled; hospital admission is indicated although death not imminent. 20   - Very sick; hospital admission necessary; active supportive treatment necessary. 10   - Moribund; fatal processes progressing rapidly. 0     - Dead  Karnofsky DA, Abelmann WH, Craver LS and Burchenal [facility] [phone]) The use of the nitrogen mustards in the palliative treatment of carcinoma: with particular reference to bronchogenic carcinoma Cancer 1 [id number]  LABORATORY DATA:  Lab Results  Component Value Date   WBC 5.1 09/04/2021   HGB 8.8 (L) 09/04/2021   HCT 26.9 (L) 09/04/2021   MCV 101.1 (H) 09/04/2021   PLT 372 09/04/2021   Lab Results  Component Value Date   NA 142 09/04/2021   K 3.7 09/04/2021   CL 113 (H) 09/04/2021   CO2 22 09/04/2021   Lab Results  Component Value Date   ALT 15 09/04/2021   AST 18 09/04/2021   ALKPHOS 32 (L) 09/04/2021   BILITOT 1.0 09/04/2021    PULMONARY FUNCTION TEST:   Review Flowsheet        No data to display          RADIOGRAPHY: [place] LT BREAST BX W LOC DEV 1ST LESION IMG BX SPEC US GUIDE Addendum Date: 04/18/2023 ADDENDUM REPORT: 04/18/2023 14:42 ADDENDUM: PATHOLOGY revealed: A. Breast,  left, needle core biopsy, mass, 3:00, 7 cmfn (heart clip) - INVASIVE MAMMARY CARCINOMA, - TUBULE FORMATION: SCORE - OVERALL GRADE: 2 - LYMPHOVASCULAR INVASION: NOT IDENTIFIED - CANCER LENGTH: 1 CM - CALCIFICATIONS: NOT IDENTIFIED. Pathology results are CONCORDANT with imaging findings, per Dr. Jacob Moores. Pathology results and recommendations were discussed with patient via telephone on 04/18/2023 by Rene Kocher RN. Patient reported biopsy site doing well with no adverse symptoms, and only slight tenderness at the site. Post biopsy care instructions were reviewed, questions were answered and my direct phone number was provided. Patient was instructed to call [facility] for any additional questions or concerns related to biopsy site. RECOMMENDATIONS: 1. Surgical and oncological consultation. Patient was referred to the [facility] with appointment on 04/24/2023. Patient will discuss results with Dr. Leonard Downing at [facility]. Pathology results reported by Randa Lynn RN on 04/18/2023. Electronically Signed  By: Jacob Moores M.D.   On: 04/18/2023 14:42   Result Date: 04/18/2023 CLINICAL DATA:  Presenting for ultrasound-guided biopsy of an indeterminate palpable mass in the LEFT breast 3 o'clock position EXAM: ULTRASOUND GUIDED LEFT BREAST CORE NEEDLE BIOPSY COMPARISON:  Previous exam(s). PROCEDURE: I met with the patient and we discussed the procedure of ultrasound-guided biopsy, including benefits and alternatives. We discussed the high likelihood of a successful procedure. We discussed the risks of the procedure, including infection, bleeding, tissue injury, clip migration, and inadequate sampling. Informed written consent was given. The usual time-out protocol was performed immediately prior to the procedure. Lesion quadrant: Upper outer quadrant Using sterile technique and 1% Lidocaine as local  anesthetic, under direct ultrasound visualization, a 12 gauge spring-loaded device was used to perform biopsy of a mass in the left breast 3 o'clock position 7 cm from the nipple using a lateral approach. At the conclusion of the procedure, a heart shaped tissue marker clip was deployed into the biopsy cavity. Follow up 2 view mammogram was performed and dictated separately. IMPRESSION: Ultrasound guided biopsy of a mass in the LEFT breast 3 o'clock position. No apparent complications. Electronically Signed: By: Jacob Moores M.D. On: 04/17/2023 15:21   MM CLIP PLACEMENT LEFT Result Date: 04/17/2023 CLINICAL DATA:  Status post ultrasound-guided biopsy of a palpable mass in the LEFT breast. EXAM: 3D DIAGNOSTIC LEFT MAMMOGRAM POST ULTRASOUND BIOPSY COMPARISON:  Previous exam(s). ACR Breast Density Category b: There are scattered areas of fibroglandular density. FINDINGS: 3D Mammographic images were obtained following ultrasound guided biopsy of a palpable mass in left breast 3 o'clock position 7 cm from nipple. The heart shaped biopsy marking clip is in expected position at the site of biopsy. IMPRESSION: Appropriate positioning of the heart shaped biopsy marking clip at the site of biopsy in the left breast 3 o'clock position. Final Assessment: Post Procedure Mammograms for Marker Placement Electronically Signed   By: Jacob Moores M.D.   On: 04/17/2023 15:55   MM 3D DIAGNOSTIC MAMMOGRAM BILATERAL BREAST Result Date: 04/10/2023 CLINICAL DATA:  78 year old female presents with focal LEFT breast thickening identified on clinical examination. Also for annual bilateral mammogram. EXAM: DIGITAL DIAGNOSTIC BILATERAL MAMMOGRAM WITH TOMOSYNTHESIS AND CAD; ULTRASOUND LEFT BREAST LIMITED TECHNIQUE: Bilateral digital diagnostic mammography and breast tomosynthesis was performed. The images were evaluated with computer-aided detection. ; Targeted ultrasound examination of the left breast was performed. COMPARISON:   Previous exam(s). ACR Breast Density Category b: There are scattered areas of fibroglandular density. FINDINGS: Full field views of both breasts demonstrate a focal asymmetry within the posterior OUTER LEFT breast. No other new or suspicious mammographic findings within either breast identified. On physical exam, focal thickening at the 3 o'clock position of the LEFT breast 7 cm from the nipple. Targeted ultrasound is performed, showing a 1.4 x 1 x 1.9 cm ill-defined heterogeneous mixed echogenicity mass at the 3 o'clock position of the LEFT breast 7 cm from the nipple, corresponding to the patient's palpable abnormality. This mass is primarily hyperechoic anteriorly and hypoechoic posteriorly. No abnormal LEFT axillary lymph nodes are noted. IMPRESSION: 1. Indeterminate 1.9 cm OUTER LEFT breast mass corresponding to the patient's palpable abnormality. Tissue sampling is recommended. No abnormal appearing LEFT axillary lymph nodes. RECOMMENDATION: Ultrasound-guided LEFT breast biopsy, which will be scheduled I have discussed the findings and recommendations with the patient. If applicable, a reminder letter will be sent to the patient regarding the next appointment. BI-RADS CATEGORY  4: Suspicious. Electronically Signed   By: Tinnie Gens  Hu M.D.   On: 04/10/2023 12:22   [place] LIMITED ULTRASOUND INCLUDING AXILLA LEFT BREAST  Result Date: 04/10/2023 CLINICAL DATA:  78 year old female presents with focal LEFT breast thickening identified on clinical examination. Also for annual bilateral mammogram. EXAM: DIGITAL DIAGNOSTIC BILATERAL MAMMOGRAM WITH TOMOSYNTHESIS AND CAD; ULTRASOUND LEFT BREAST LIMITED TECHNIQUE: Bilateral digital diagnostic mammography and breast tomosynthesis was performed. The images were evaluated with computer-aided detection. ; Targeted ultrasound examination of the left breast was performed. COMPARISON:  Previous exam(s). ACR Breast Density Category b: There are scattered areas of fibroglandular  density. FINDINGS: Full field views of both breasts demonstrate a focal asymmetry within the posterior OUTER LEFT breast. No other new or suspicious mammographic findings within either breast identified. On physical exam, focal thickening at the 3 o'clock position of the LEFT breast 7 cm from the nipple. Targeted ultrasound is performed, showing a 1.4 x 1 x 1.9 cm ill-defined heterogeneous mixed echogenicity mass at the 3 o'clock position of the LEFT breast 7 cm from the nipple, corresponding to the patient's palpable abnormality. This mass is primarily hyperechoic anteriorly and hypoechoic posteriorly. No abnormal LEFT axillary lymph nodes are noted. IMPRESSION: 1. Indeterminate 1.9 cm OUTER LEFT breast mass corresponding to the patient's palpable abnormality. Tissue sampling is recommended. No abnormal appearing LEFT axillary lymph nodes. RECOMMENDATION: Ultrasound-guided LEFT breast biopsy, which will be scheduled I have discussed the findings and recommendations with the patient. If applicable, a reminder letter will be sent to the patient regarding the next appointment. BI-RADS CATEGORY  4: Suspicious. Electronically Signed   By: Harmon Pier M.D.   On: 04/10/2023 12:22      IMPRESSION: ***  Patient will be a good candidate for breast conservation with radiotherapy to left breast. We discussed the general course of radiation, potential side effects, and toxicities with radiation and the patient is interested in this approach. ***   PLAN:  ***    ------------------------------------------------   Bryan Lemma, PA-C   Billie Lade, PhD, MD   [facility]  Radiation Oncology Direct Dial: [phone]  Fax: [phone] [email or web address]

## 2023-04-22 NOTE — Telephone Encounter (Signed)
 Spoke to patient to confirm upcoming afternoon [facility] clinic appointment on 3/12, paperwork will be sent via mail.  Gave location and time, also informed patient that the surgeon's office would be calling as well to get information from them similar to the packet that they will be receiving so make sure to do both.  Reminded patient that all providers will be coming to the clinic to see them HERE and if they had any questions to not hesitate to reach back out to myself or their navigators.

## 2023-04-23 NOTE — Progress Notes (Unsigned)
 Wendy Solis CONSULT NOTE  Patient Care Team: [name], MD as PCP - General (Family Medicine) Pershing Proud, RN as Oncology Nurse Navigator Donnelly Angelica, RN as Oncology Nurse Navigator Antony Blackbird, MD as Consulting Physician (Radiation Oncology) Rachel Moulds, MD as Consulting Physician (Hematology and Oncology) Emelia Loron, MD as Consulting Physician (General Surgery)  CHIEF COMPLAINTS/PURPOSE OF CONSULTATION:  New diagnosis of left sided breast cancer  ASSESSMENT & PLAN:  Malignant neoplasm of upper-outer quadrant of left breast in female, estrogen receptor positive [facility]) This is a very pleasant 78 yr old with newly diagnosed left breast IDC, grade 2, ER positive, PR positive, Her 2 neg, Ki 67 of 3% referred to breast MDC for additional recommendations.  Given node neg, strong ER and PR positive tumor with low proliferation index, we have recommended surgery upfront followed by consideration for oncotype and anti estrogen therapy. We have discussed options for antiestrogen therapy today  With regards to Tamoxifen, we discussed that this is a SERM, selective estrogen receptor modulator. We discussed mechanism of action of Tamoxifen, adverse effects on Tamoxifen including but not limited to post menopausal symptoms, increased risk of DVT/PE, increased risk of endometrial cancer, questionable cataracts with long term use and increased risk of cardiovascular events in the study which was not statistically significant. A benefit from Tamoxifen would be improvement in bone density.  With regards to aromatase inhibitors, we discussed mechanism of action, adverse effects including but not limited to post menopausal symptoms, arthralgias, myalgias, increased risk of cardiovascular events and bone loss.  Tamoxifen can be used in premenopausal and post menopausal women. Aromatase inhibitors can only be used in premenopausal women along with OFS.    No orders of  the defined types were placed in this encounter.    HISTORY OF PRESENTING ILLNESS:  Wendy Solis 77 y.o. adult is here because of left sided breast cancer  Oncology History  Malignant neoplasm of upper-outer quadrant of left breast in female, estrogen receptor positive (HCC)  04/10/2023 Mammogram   Indeterminate 1.9 cm OUTER LEFT breast mass corresponding to the patient's palpable abnormality. Tissue sampling is recommended. No abnormal appearing LEFT axillary lymph nodes.   04/22/2023 Initial Diagnosis   Malignant neoplasm of upper-outer quadrant of left breast in female, estrogen receptor positive [facility])    Pathology Results   Left breast needle core biopsy showed grade 2 IDC. ER 90% positive strong staining intensity, PR 95% strong staining intensity, group 5 Her 2 neg Ki 67 3 %    Discussed the use of AI scribe software for clinical note transcription with the patient, who gave verbal consent to proceed.  History of Present Illness      REVIEW OF SYSTEMS:   Constitutional: Denies fevers, chills or abnormal night sweats Eyes: Denies blurriness of vision, double vision or watery eyes Ears, nose, mouth, throat, and face: Denies mucositis or sore throat Respiratory: Denies cough, dyspnea or wheezes Cardiovascular: Denies palpitation, chest discomfort or lower extremity swelling Gastrointestinal:  Denies nausea, heartburn or change in bowel habits Skin: Denies abnormal skin rashes Lymphatics: Denies new lymphadenopathy or easy bruising Neurological:Denies numbness, tingling or new weaknesses Behavioral/Psych: Mood is stable, no new changes  All other systems were reviewed with the patient and are negative.  MEDICAL HISTORY:  No past medical history on file.  SURGICAL HISTORY: Past Surgical History:  Procedure Laterality Date   BREAST BIOPSY Left 04/17/2023   [place] LT BREAST BX W LOC DEV 1ST LESION IMG BX SPEC  US GUIDE 04/17/2023 [facility] MAMMOGRAPHY    SOCIAL  HISTORY: Social History   Socioeconomic History   Marital status: Married    Spouse name: Not on file   Number of children: Not on file   Years of education: Not on file   Highest education level: Not on file  Occupational History   Not on file  Tobacco Use   Smoking status: Never   Smokeless tobacco: Never  Vaping Use   Vaping status: Never Used  Substance and Sexual Activity   Alcohol use: Yes    Comment: social   Drug use: Never   Sexual activity: Not on file  Other Topics Concern   Not on file  Social History Narrative   Not on file   Social Drivers of Health   Financial Resource Strain: Not on file  Food Insecurity: Not on file  Transportation Needs: Not on file  Physical Activity: Not on file  Stress: Not on file  Social Connections: Not on file  Intimate Partner Violence: Not on file    FAMILY HISTORY: Family History  Problem Relation Age of Onset   Breast cancer Neg Hx     ALLERGIES:  is allergic to atorvastatin, cyclobenzaprine, sulfonamide derivatives, and tizanidine.  MEDICATIONS:  Current Outpatient Medications  Medication Sig Dispense Refill   valACYclovir (VALTREX) 1000 MG tablet Take 1 tablet (1,000 mg total) by mouth 3 (three) times daily. 21 tablet 0   No current facility-administered medications for this visit.     PHYSICAL EXAMINATION: ECOG PERFORMANCE STATUS: {CHL ONC ECOG PS:[id number]}  There were no vitals filed for this visit. There were no vitals filed for this visit.  GENERAL:alert, no distress and comfortable SKIN: skin color, texture, turgor are normal, no rashes or significant lesions EYES: normal, conjunctiva are pink and non-injected, sclera clear OROPHARYNX:no exudate, no erythema and lips, buccal mucosa, and tongue normal  NECK: supple, thyroid normal size, non-tender, without nodularity LYMPH:  no palpable lymphadenopathy in the cervical, axillary LUNGS: clear to auscultation and percussion with normal breathing  effort HEART: regular rate & rhythm and no murmurs and no lower extremity edema ABDOMEN:abdomen soft, non-tender and normal bowel sounds Musculoskeletal:no cyanosis of digits and no clubbing  PSYCH: alert & oriented x 3 with fluent speech NEURO: no focal motor/sensory deficits  LABORATORY DATA:  I have reviewed the data as listed Lab Results  Component Value Date   WBC 5.1 09/04/2021   HGB 8.8 (L) 09/04/2021   HCT 26.9 (L) 09/04/2021   MCV 101.1 (H) 09/04/2021   PLT 372 09/04/2021     Chemistry      Component Value Date/Time   NA 142 09/04/2021 1600   K 3.7 09/04/2021 1600   CL 113 (H) 09/04/2021 1600   CO2 22 09/04/2021 1600   BUN 18 09/04/2021 1600   CREATININE 0.78 09/04/2021 1600      Component Value Date/Time   CALCIUM 9.1 09/04/2021 1600   ALKPHOS 32 (L) 09/04/2021 1600   AST 18 09/04/2021 1600   ALT 15 09/04/2021 1600   BILITOT 1.0 09/04/2021 1600       RADIOGRAPHIC STUDIES: I have personally reviewed the radiological images as listed and agreed with the findings in the report. [place] LT BREAST BX W LOC DEV 1ST LESION IMG BX SPEC US GUIDE Addendum Date: 04/18/2023 ADDENDUM REPORT: 04/18/2023 14:42 ADDENDUM: PATHOLOGY revealed: A. Breast, left, needle core biopsy, mass, 3:00, 7 cmfn (heart clip) - INVASIVE MAMMARY CARCINOMA, - TUBULE FORMATION: SCORE - OVERALL GRADE:  2 - LYMPHOVASCULAR INVASION: NOT IDENTIFIED - CANCER LENGTH: 1 CM - CALCIFICATIONS: NOT IDENTIFIED. Pathology results are CONCORDANT with imaging findings, per Dr. Jacob Moores. Pathology results and recommendations were discussed with patient via telephone on 04/18/2023 by Rene Kocher RN. Patient reported biopsy site doing well with no adverse symptoms, and only slight tenderness at the site. Post biopsy care instructions were reviewed, questions were answered and my direct phone number was provided. Patient was instructed to call [facility] for any additional questions or concerns  related to biopsy site. RECOMMENDATIONS: 1. Surgical and oncological consultation. Patient was referred to [facility] at [facility] with appointment on 04/24/2023. Patient will discuss results with Dr. Leonard Downing at [facility]. Pathology results reported by Randa Lynn RN on 04/18/2023. Electronically Signed   By: Jacob Moores M.D.   On: 04/18/2023 14:42   Result Date: 04/18/2023 CLINICAL DATA:  Presenting for ultrasound-guided biopsy of an indeterminate palpable mass in the LEFT breast 3 o'clock position EXAM: ULTRASOUND GUIDED LEFT BREAST CORE NEEDLE BIOPSY COMPARISON:  Previous exam(s). PROCEDURE: I met with the patient and we discussed the procedure of ultrasound-guided biopsy, including benefits and alternatives. We discussed the high likelihood of a successful procedure. We discussed the risks of the procedure, including infection, bleeding, tissue injury, clip migration, and inadequate sampling. Informed written consent was given. The usual time-out protocol was performed immediately prior to the procedure. Lesion quadrant: Upper outer quadrant Using sterile technique and 1% Lidocaine as local anesthetic, under direct ultrasound visualization, a 12 gauge spring-loaded device was used to perform biopsy of a mass in the left breast 3 o'clock position 7 cm from the nipple using a lateral approach. At the conclusion of the procedure, a heart shaped tissue marker clip was deployed into the biopsy cavity. Follow up 2 view mammogram was performed and dictated separately. IMPRESSION: Ultrasound guided biopsy of a mass in the LEFT breast 3 o'clock position. No apparent complications. Electronically Signed: By: Jacob Moores M.D. On: 04/17/2023 15:21   MM CLIP PLACEMENT LEFT Result Date: 04/17/2023 CLINICAL DATA:  Status post ultrasound-guided biopsy of a palpable mass in the LEFT breast. EXAM: 3D DIAGNOSTIC LEFT MAMMOGRAM POST  ULTRASOUND BIOPSY COMPARISON:  Previous exam(s). ACR Breast Density Category b: There are scattered areas of fibroglandular density. FINDINGS: 3D Mammographic images were obtained following ultrasound guided biopsy of a palpable mass in left breast 3 o'clock position 7 cm from nipple. The heart shaped biopsy marking clip is in expected position at the site of biopsy. IMPRESSION: Appropriate positioning of the heart shaped biopsy marking clip at the site of biopsy in the left breast 3 o'clock position. Final Assessment: Post Procedure Mammograms for Marker Placement Electronically Signed   By: Jacob Moores M.D.   On: 04/17/2023 15:55   MM 3D DIAGNOSTIC MAMMOGRAM BILATERAL BREAST Result Date: 04/10/2023 CLINICAL DATA:  78 year old female presents with focal LEFT breast thickening identified on clinical examination. Also for annual bilateral mammogram. EXAM: DIGITAL DIAGNOSTIC BILATERAL MAMMOGRAM WITH TOMOSYNTHESIS AND CAD; ULTRASOUND LEFT BREAST LIMITED TECHNIQUE: Bilateral digital diagnostic mammography and breast tomosynthesis was performed. The images were evaluated with computer-aided detection. ; Targeted ultrasound examination of the left breast was performed. COMPARISON:  Previous exam(s). ACR Breast Density Category b: There are scattered areas of fibroglandular density. FINDINGS: Full field views of both breasts demonstrate a focal asymmetry within the posterior OUTER LEFT breast. No other new or suspicious mammographic findings  within either breast identified. On physical exam, focal thickening at the 3 o'clock position of the LEFT breast 7 cm from the nipple. Targeted ultrasound is performed, showing a 1.4 x 1 x 1.9 cm ill-defined heterogeneous mixed echogenicity mass at the 3 o'clock position of the LEFT breast 7 cm from the nipple, corresponding to the patient's palpable abnormality. This mass is primarily hyperechoic anteriorly and hypoechoic posteriorly. No abnormal LEFT axillary lymph nodes are  noted. IMPRESSION: 1. Indeterminate 1.9 cm OUTER LEFT breast mass corresponding to the patient's palpable abnormality. Tissue sampling is recommended. No abnormal appearing LEFT axillary lymph nodes. RECOMMENDATION: Ultrasound-guided LEFT breast biopsy, which will be scheduled I have discussed the findings and recommendations with the patient. If applicable, a reminder letter will be sent to the patient regarding the next appointment. BI-RADS CATEGORY  4: Suspicious. Electronically Signed   By: Harmon Pier M.D.   On: 04/10/2023 12:22   [place] LIMITED ULTRASOUND INCLUDING AXILLA LEFT BREAST  Result Date: 04/10/2023 CLINICAL DATA:  78 year old female presents with focal LEFT breast thickening identified on clinical examination. Also for annual bilateral mammogram. EXAM: DIGITAL DIAGNOSTIC BILATERAL MAMMOGRAM WITH TOMOSYNTHESIS AND CAD; ULTRASOUND LEFT BREAST LIMITED TECHNIQUE: Bilateral digital diagnostic mammography and breast tomosynthesis was performed. The images were evaluated with computer-aided detection. ; Targeted ultrasound examination of the left breast was performed. COMPARISON:  Previous exam(s). ACR Breast Density Category b: There are scattered areas of fibroglandular density. FINDINGS: Full field views of both breasts demonstrate a focal asymmetry within the posterior OUTER LEFT breast. No other new or suspicious mammographic findings within either breast identified. On physical exam, focal thickening at the 3 o'clock position of the LEFT breast 7 cm from the nipple. Targeted ultrasound is performed, showing a 1.4 x 1 x 1.9 cm ill-defined heterogeneous mixed echogenicity mass at the 3 o'clock position of the LEFT breast 7 cm from the nipple, corresponding to the patient's palpable abnormality. This mass is primarily hyperechoic anteriorly and hypoechoic posteriorly. No abnormal LEFT axillary lymph nodes are noted. IMPRESSION: 1. Indeterminate 1.9 cm OUTER LEFT breast mass corresponding to the patient's  palpable abnormality. Tissue sampling is recommended. No abnormal appearing LEFT axillary lymph nodes. RECOMMENDATION: Ultrasound-guided LEFT breast biopsy, which will be scheduled I have discussed the findings and recommendations with the patient. If applicable, a reminder letter will be sent to the patient regarding the next appointment. BI-RADS CATEGORY  4: Suspicious. Electronically Signed   By: Harmon Pier M.D.   On: 04/10/2023 12:22    All questions were answered. The patient knows to call the clinic with any problems, questions or concerns. I spent *** minutes in the care of this patient including H and P, review of records, counseling and coordination of care.     Rachel Moulds, MD 04/23/2023 6:04 PM

## 2023-04-23 NOTE — Assessment & Plan Note (Signed)
 This is a very pleasant 78 yr old with newly diagnosed left breast IDC, grade 2, ER positive, PR positive, Her 2 neg, Ki 67 of 3% referred to breast MDC for additional recommendations.  Given node neg, strong ER and PR positive tumor with low proliferation index, we have recommended surgery upfront followed by consideration for oncotype and anti estrogen therapy. We have discussed options for antiestrogen therapy today  With regards to Tamoxifen, we discussed that this is a SERM, selective estrogen receptor modulator. We discussed mechanism of action of Tamoxifen, adverse effects on Tamoxifen including but not limited to post menopausal symptoms, increased risk of DVT/PE, increased risk of endometrial cancer, questionable cataracts with long term use and increased risk of cardiovascular events in the study which was not statistically significant. A benefit from Tamoxifen would be improvement in bone density.  With regards to aromatase inhibitors, we discussed mechanism of action, adverse effects including but not limited to post menopausal symptoms, arthralgias, myalgias, increased risk of cardiovascular events and bone loss.  Tamoxifen can be used in premenopausal and post menopausal women. Aromatase inhibitors can only be used in premenopausal women along with OFS.

## 2023-04-24 ENCOUNTER — Inpatient Hospital Stay: Payer: MEDICARE | Admitting: Licensed Clinical Social Worker

## 2023-04-24 ENCOUNTER — Inpatient Hospital Stay (HOSPITAL_BASED_OUTPATIENT_CLINIC_OR_DEPARTMENT_OTHER): Payer: MEDICARE | Admitting: Hematology and Oncology

## 2023-04-24 ENCOUNTER — Ambulatory Visit: Payer: MEDICARE | Admitting: Radiation Oncology

## 2023-04-24 ENCOUNTER — Other Ambulatory Visit: Payer: Self-pay | Admitting: General Surgery

## 2023-04-24 ENCOUNTER — Inpatient Hospital Stay: Payer: MEDICARE | Attending: Hematology and Oncology

## 2023-04-24 ENCOUNTER — Inpatient Hospital Stay (HOSPITAL_BASED_OUTPATIENT_CLINIC_OR_DEPARTMENT_OTHER): Payer: MEDICARE | Admitting: Genetic Counselor

## 2023-04-24 ENCOUNTER — Encounter: Payer: Self-pay | Admitting: Genetic Counselor

## 2023-04-24 ENCOUNTER — Encounter: Payer: Self-pay | Admitting: *Deleted

## 2023-04-24 ENCOUNTER — Ambulatory Visit
Admission: RE | Admit: 2023-04-24 | Discharge: 2023-04-24 | Disposition: A | Discharge status: Home / Self Care | Payer: MEDICARE | Source: Ambulatory Visit | Attending: Radiation Oncology | Admitting: Radiation Oncology

## 2023-04-24 ENCOUNTER — Ambulatory Visit: Payer: MEDICARE | Admitting: Physical Therapy

## 2023-04-24 VITALS — BP 133/69 | HR 73 | Temp 98.6°F | Resp 18 | Ht 62.0 in | Wt 150.9 lb

## 2023-04-24 DIAGNOSIS — Z170 Estrogen receptor positive status [ER+]: Secondary | ICD-10-CM

## 2023-04-24 DIAGNOSIS — C50412 Malignant neoplasm of upper-outer quadrant of left female breast: Secondary | ICD-10-CM

## 2023-04-24 DIAGNOSIS — D469 Myelodysplastic syndrome, unspecified: Secondary | ICD-10-CM

## 2023-04-24 DIAGNOSIS — D259 Leiomyoma of uterus, unspecified: Secondary | ICD-10-CM

## 2023-04-24 DIAGNOSIS — Z1732 Human epidermal growth factor receptor 2 negative status: Secondary | ICD-10-CM | POA: Insufficient documentation

## 2023-04-24 DIAGNOSIS — M8580 Other specified disorders of bone density and structure, unspecified site: Secondary | ICD-10-CM | POA: Insufficient documentation

## 2023-04-24 DIAGNOSIS — Z1721 Progesterone receptor positive status: Secondary | ICD-10-CM

## 2023-04-24 DIAGNOSIS — Z800 Family history of malignant neoplasm of digestive organs: Secondary | ICD-10-CM

## 2023-04-24 LAB — CBC WITH DIFFERENTIAL (CANCER CENTER ONLY)
Abs Immature Granulocytes: 0.07 10*3/uL (ref 0.00–0.07)
Basophils Absolute: 0.1 10*3/uL (ref 0.0–0.1)
Basophils Relative: 1 %
Eosinophils Absolute: 0.1 10*3/uL (ref 0.0–0.5)
Eosinophils Relative: 2 %
HCT: 29.4 % — ABNORMAL LOW (ref 36.0–46.0)
Hemoglobin: 9.8 g/dL — ABNORMAL LOW (ref 12.0–15.0)
Immature Granulocytes: 1 %
Lymphocytes Relative: 39 %
Lymphs Abs: 2.7 10*3/uL (ref 0.7–4.0)
MCH: 33 pg (ref 26.0–34.0)
MCHC: 33.3 g/dL (ref 30.0–36.0)
MCV: 99 fL (ref 80.0–100.0)
Monocytes Absolute: 0.6 10*3/uL (ref 0.1–1.0)
Monocytes Relative: 9 %
Neutro Abs: 3.4 10*3/uL (ref 1.7–7.7)
Neutrophils Relative %: 48 %
Platelet Count: 471 10*3/uL — ABNORMAL HIGH (ref 150–400)
RBC: 2.97 MIL/uL — ABNORMAL LOW (ref 3.87–5.11)
RDW: 15.3 % (ref 11.5–15.5)
WBC Count: 7 10*3/uL (ref 4.0–10.5)
nRBC: 0 % (ref 0.0–0.2)

## 2023-04-24 LAB — CMP (CANCER CENTER ONLY)
ALT: 11 U/L (ref 0–44)
AST: 14 U/L — ABNORMAL LOW (ref 15–41)
Albumin: 4.3 g/dL (ref 3.5–5.0)
Alkaline Phosphatase: 33 U/L — ABNORMAL LOW (ref 38–126)
Anion gap: 4 — ABNORMAL LOW (ref 5–15)
BUN: 18 mg/dL (ref 8–23)
CO2: 27 mmol/L (ref 22–32)
Calcium: 9.2 mg/dL (ref 8.9–10.3)
Chloride: 106 mmol/L (ref 98–111)
Creatinine: 0.8 mg/dL (ref 0.44–1.00)
GFR, Estimated: >60 mL/min (ref >60)
Glucose, Bld: 99 mg/dL (ref 70–99)
Potassium: 4.8 mmol/L (ref 3.5–5.1)
Sodium: 137 mmol/L (ref 135–145)
Total Bilirubin: 0.8 mg/dL (ref 0.0–1.2)
Total Protein: 6.9 g/dL (ref 6.5–8.1)

## 2023-04-24 NOTE — Progress Notes (Signed)
 REFERRING PROVIDER: Rachel Moulds, MD [address] [place],  [place] [address]  PRIMARY PROVIDER:  [name], MD  PRIMARY REASON FOR VISIT:  1. Malignant neoplasm of upper-outer quadrant of left breast in female, estrogen receptor positive (HCC)   2. Myelodysplastic syndrome (HCC)   3. Family history of pancreatic cancer     HISTORY OF PRESENT ILLNESS:   [name], a 78 y.o. female, was seen for a [place] cancer genetics consultation at the request of Dr. Al Pimple due to a personal history of breast cancer.  Wendy Solis presents to clinic today to discuss the possibility of a hereditary predisposition to cancer, to discuss genetic testing, and to further clarify her future cancer risks, as well as potential cancer risks for family members.   In March 2025, at the age of 18, Wendy Solis was diagnosed with invasive ductal carcinoma of the left breast (ER+/PR+/HER2-). The treatment plan is pending.  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   CANCER HISTORY:  Oncology History  Malignant neoplasm of upper-outer quadrant of left breast in female, estrogen receptor positive (HCC)  04/10/2023 Mammogram   Indeterminate 1.9 cm OUTER LEFT breast mass corresponding to the patient's palpable abnormality. Tissue sampling is recommended. No abnormal appearing LEFT axillary lymph nodes.   04/22/2023 Initial Diagnosis   Malignant neoplasm of upper-outer quadrant of left breast in female, estrogen receptor positive [facility])    Pathology Results   Left breast needle core biopsy showed grade 2 IDC. ER 90% positive strong staining intensity, PR 95% strong staining intensity, group 5 Her 2 neg Ki 67 3 %   04/24/2023 Cancer Staging   Staging form: Breast, AJCC 8th Edition - Clinical stage from 04/24/2023: Stage IA (cT1c, cN0, cM0, G2, ER+, PR+, HER2-) - Signed by Rachel Moulds, MD on 04/24/2023 Stage prefix: Initial diagnosis Histologic grading  system: 3 grade system Laterality: Left Staged by: Pathologist and managing physician Stage used in treatment planning: Yes National guidelines used in treatment planning: Yes Type of national guideline used in treatment planning: NCCN     No past medical history on file.  Past Surgical History:  Procedure Laterality Date   BREAST BIOPSY Left 04/17/2023   [place] LT BREAST BX W LOC DEV 1ST LESION IMG BX SPEC US GUIDE 04/17/2023 [facility] MAMMOGRAPHY    FAMILY HISTORY:  We obtained a detailed, 4-generation family history.  Significant diagnoses are listed below: Family History  Problem Relation Age of Onset   Pancreatic cancer Sister 62     Ms. Donze is unaware of previous family history of genetic testing for hereditary cancer risks.  Other relatives, such as her sister who had pancreatic cancer, were unavailable for genetic testing at this time.    There is no reported Ashkenazi Jewish ancestry. There is no known consanguinity.  GENETIC COUNSELING ASSESSMENT: Wendy Solis is a 78 y.o. female with a personal and family history of cancer which is somewhat suggestive of a hereditary cancer syndrome and predisposition to cancer given the presence of related cancers in the family (breast, pancreatic, etc). We, therefore, discussed and recommended the following at today's visit.   DISCUSSION: We discussed that 5 - 10% of cancer is hereditary.  Most cases of hereditary breast cancer are associated with mutations in BRCA1/2.  There are other genes that can be associated with hereditary breast, pancreatic, MDS, or other cancer syndromes.  We discussed that testing is beneficial for several reasons including knowing how  to follow individuals for their cancer risks and understanding if other family members could be at an increased risk for cancer and allowing them to undergo genetic testing.   We reviewed the characteristics, features and inheritance patterns of hereditary cancer syndromes. We also  discussed genetic testing, including the appropriate family members to test, the process of testing, insurance coverage and turn-around-time for results. We discussed the implications of a negative, positive, carrier and/or variant of uncertain significant result. We recommended Wendy Solis pursue genetic testing for a panel that includes genes associated with breast cancer, pancreatic cancer, MDS, and other cancers  The [facility] Comprehensive Common Cancer Panel includes sequencing and deletion/duplication analysis of the following genes: APC, ATM, AXIN2, BAP1, BARD1, BMPR1A, BRCA1, BRCA2, BRIP1, CDH1, CDK4, CDKN2A, CHEK2, CTNNA1, EPCAM, FANCC, FANCM, FH, FLCN, HOXB13, MET, MITF, MLH1, MSH2, MSH3, MSH6, MUTYH, NBN, NF1, NTHL1, PALB2, PMS2, POLD1, POLE, POT1, PTEN, RAD51C, RAD51D, RECQL, SCG5 (GREM1), SDHB, SDHC, SDHD, SMAD4, STK11, TP53, TSC1, TSC2, VHL.  The [facility] Leukemia/MDS Panel includes sequencing and deletion/duplication analysis of the following genes: ANKRD26, CEBPA, DDX41, ETV6, GATA2, IKZF1, PAX5, POT1, RTEL1, RUNX1, SAMD9, SAMD9L, SRP72, TERC, TERT, TINF2, TP53.   Based on Wendy Solis's personal history of breast cancer and family history of pancreatic cancer in her sister, she meets NCCN criteria (High Risk BOPP) for genetic testing.  She also meets NCCN criteria for genetic testing for MDS genes given her personal history of MDS in addition to another primary (breast cancer).  Other relatives are unavailable for genetic testing at this time. Despite that she meets criteria, she may still have an out of pocket cost.   We reviewed with Wendy Solis that given her personal history of MDS, a skin fibroblast sample (as opposed to a blood or saliva sample) is needed for genetic testing.    PLAN: After considering the risks, benefits, and limitations, [name] provided informed consent to pursue genetic testing.  Skin fibroblast sample will be obtained during her surgery with Dr. Dwain Sarna and sent to  [facility] for culturing and analysis of the above panels.   Results should be available within approximately 6 weeks after sample collection, at which point they will be disclosed by telephone to Ms. Panozzo, as will any additional recommendations warranted by these results. Ms. Cocker will receive a summary of her genetic counseling visit and a copy of her results once available. This information will also be available in Epic.   Based on Ms. Romberger's family history, we recommended her sister, who was diagnosed with pancreatic cancer, have genetic counseling and testing. Ms. Lennartz can let us know if we can be of any assistance in coordinating genetic counseling and/or testing for appropriate relatives.   Ms. Irani questions were answered to her satisfaction today. Our contact information was provided should additional questions or concerns arise. Thank you for the referral and allowing [place] to share in the care of your patient.   Amberlin Utke M. Rennie Plowman, MS, [facility] Genetic Counselor [email or web address] (P) [phone]   35 minutes were spent on the date of the encounter in service to the patient including preparation, face-to-face consultation, documentation and care coordination.  The patient was accompanied by her sister and husband.  Dr. Al Pimple was available to discuss this case as needed.    _______________________________________________________________________ For Office Staff:  Number of people involved in session: 3 Was an Intern/ student involved with case: no

## 2023-04-24 NOTE — Progress Notes (Signed)
 [facility] Clinical Social Work  Initial Assessment   Wendy Solis is a 78 y.o. year old female accompanied by husband (Joe) and sister Jasmine December) . Clinical Social Work was referred by  [facility]  for assessment of psychosocial needs.   SDOH (Social Determinants of Health) assessments performed: Yes SDOH Interventions    Flowsheet Row Clinical Support from 04/24/2023 in [facility] Of [facility]  SDOH Interventions   Food Insecurity Interventions Intervention Not Indicated  Housing Interventions Intervention Not Indicated  Transportation Interventions Intervention Not Indicated  Utilities Interventions Intervention Not Indicated  Depression Interventions/Treatment  Counseling       SDOH Screenings   Food Insecurity: No Food Insecurity (04/24/2023)  Housing: Low Risk  (04/24/2023)  Transportation Needs: No Transportation Needs (04/24/2023)  Utilities: Not At Risk (04/24/2023)  Depression (PHQ2-9): Low Risk  (04/24/2023)  Tobacco Use: Low Risk  (04/24/2023)   Received from [facility]     Distress Screen completed: No     No data to display          Family/Social Information:  Housing Arrangement: patient lives with husband Joe . They have a son in [place] and daughter in [place]- the kids don't know yet about diagnosis Family members/support persons in your life? Family- husband, two sisters Transportation concerns: no  Employment: Retired .  Income source: Actor concerns: No Type of concern: None Food access concerns: no Advanced directives: Not known Services Currently in place:  Medicare + BCBS; started counseling   Coping/ Adjustment to diagnosis: Patient understands treatment plan and what happens next? Yes. Pt and husband do have other stressors currently (pt with MDS, husband with Lewy body dementia and heart concerns). They are trying to be positive and proactive with supports  where they can.  Concerns about diagnosis and/or treatment:  Cost of medication Patient reported stressors: Depression and Adjusting to my illness- started counseling recently related to the other health stressors Patient enjoys  exercise, cleaning, gardening Current coping skills/ strengths: Ability for insight , Communication skills , Special hobby/interest , and Supportive family/friends     SUMMARY: Current SDOH Barriers:  No major barriers identified today  Clinical Social Work Clinical Goal(s):  No clinical social work goals at this time  Interventions: Discussed common feeling and emotions when being diagnosed with cancer, and the importance of support during treatment Informed patient of the support team roles and support services at [facility] Provided CSW contact information and encouraged patient to call with any questions or concerns   Follow Up Plan: Patient will contact CSW with any support or resource needs Patient verbalizes understanding of plan: Yes    Myiah Petkus E Meher Kucinski, LCSW Clinical Social Worker [facility]

## 2023-05-01 ENCOUNTER — Telehealth: Payer: Self-pay | Admitting: *Deleted

## 2023-05-01 ENCOUNTER — Encounter: Payer: Self-pay | Admitting: *Deleted

## 2023-05-01 NOTE — Telephone Encounter (Signed)
 Spoke with patient to follow up from [facility] 3/12 and assess navigation needs. 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  She wanted everyone to know she was thrilled with everyone she just wanted to keep everything in one place.  Encouraged her to call should she need [place] in the future.

## 2023-05-02 ENCOUNTER — Telehealth: Payer: Self-pay | Admitting: Genetic Counselor

## 2023-05-02 NOTE — Telephone Encounter (Signed)
 Patient previously consented to hereditary cancer genetic testing with plans to obtain tissue sample for culturing during surgery for breast cancer.  Now moving forward with treatment/surgery at [facility].  Explained that we are unable to coordinate sample collection during surgery at [facility].  Encouraged her to call at any time in the future if she would like to set up skin punch biopsy procedure for genetic testing through our medical genetics office.  Informed her that [facility] also has cancer genetics team that can discuss sample collection options.  Patient expressed gratitude and understanding.  Contact information provided.    Twana First, MS, CGC Genetic Counselor [email or web address] (P) [phone]
